# Patient Record
Sex: Female | Born: 1935 | Race: White | Hispanic: No | Smoking: Former smoker
Health system: Southern US, Community
[De-identification: ages and names within clinical notes are randomized; demographics above are authoritative.]

## PROBLEM LIST (undated history)

## (undated) DIAGNOSIS — C801 Malignant (primary) neoplasm, unspecified: Secondary | ICD-10-CM

## (undated) DIAGNOSIS — R351 Nocturia: Secondary | ICD-10-CM

## (undated) DIAGNOSIS — N189 Chronic kidney disease, unspecified: Secondary | ICD-10-CM

## (undated) DIAGNOSIS — I10 Essential (primary) hypertension: Secondary | ICD-10-CM

## (undated) DIAGNOSIS — Z86018 Personal history of other benign neoplasm: Secondary | ICD-10-CM

## (undated) DIAGNOSIS — R682 Dry mouth, unspecified: Secondary | ICD-10-CM

## (undated) DIAGNOSIS — G4700 Insomnia, unspecified: Secondary | ICD-10-CM

## (undated) DIAGNOSIS — R32 Unspecified urinary incontinence: Secondary | ICD-10-CM

## (undated) DIAGNOSIS — Z8541 Personal history of malignant neoplasm of cervix uteri: Secondary | ICD-10-CM

## (undated) DIAGNOSIS — F419 Anxiety disorder, unspecified: Secondary | ICD-10-CM

## (undated) DIAGNOSIS — R2689 Other abnormalities of gait and mobility: Secondary | ICD-10-CM

## (undated) DIAGNOSIS — F329 Major depressive disorder, single episode, unspecified: Secondary | ICD-10-CM

## (undated) DIAGNOSIS — K5900 Constipation, unspecified: Secondary | ICD-10-CM

## (undated) DIAGNOSIS — K219 Gastro-esophageal reflux disease without esophagitis: Secondary | ICD-10-CM

## (undated) DIAGNOSIS — R0602 Shortness of breath: Secondary | ICD-10-CM

## (undated) DIAGNOSIS — F32A Depression, unspecified: Secondary | ICD-10-CM

## (undated) DIAGNOSIS — H3530 Unspecified macular degeneration: Secondary | ICD-10-CM

## (undated) DIAGNOSIS — R3915 Urgency of urination: Secondary | ICD-10-CM

## (undated) DIAGNOSIS — R51 Headache: Secondary | ICD-10-CM

## (undated) DIAGNOSIS — N3020 Other chronic cystitis without hematuria: Secondary | ICD-10-CM

## (undated) DIAGNOSIS — H269 Unspecified cataract: Secondary | ICD-10-CM

## (undated) DIAGNOSIS — K649 Unspecified hemorrhoids: Secondary | ICD-10-CM

## (undated) DIAGNOSIS — E7800 Pure hypercholesterolemia, unspecified: Secondary | ICD-10-CM

## (undated) DIAGNOSIS — M549 Dorsalgia, unspecified: Secondary | ICD-10-CM

## (undated) DIAGNOSIS — M797 Fibromyalgia: Secondary | ICD-10-CM

## (undated) DIAGNOSIS — H9190 Unspecified hearing loss, unspecified ear: Secondary | ICD-10-CM

## (undated) DIAGNOSIS — IMO0001 Reserved for inherently not codable concepts without codable children: Secondary | ICD-10-CM

## (undated) DIAGNOSIS — H6690 Otitis media, unspecified, unspecified ear: Secondary | ICD-10-CM

## (undated) DIAGNOSIS — R519 Headache, unspecified: Secondary | ICD-10-CM

## (undated) DIAGNOSIS — N3010 Interstitial cystitis (chronic) without hematuria: Secondary | ICD-10-CM

## (undated) DIAGNOSIS — K589 Irritable bowel syndrome without diarrhea: Secondary | ICD-10-CM

## (undated) DIAGNOSIS — Z9889 Other specified postprocedural states: Secondary | ICD-10-CM

## (undated) DIAGNOSIS — R350 Frequency of micturition: Secondary | ICD-10-CM

## (undated) DIAGNOSIS — D649 Anemia, unspecified: Secondary | ICD-10-CM

## (undated) DIAGNOSIS — G8929 Other chronic pain: Secondary | ICD-10-CM

## (undated) DIAGNOSIS — H9319 Tinnitus, unspecified ear: Secondary | ICD-10-CM

## (undated) DIAGNOSIS — J45909 Unspecified asthma, uncomplicated: Secondary | ICD-10-CM

## (undated) DIAGNOSIS — M48061 Spinal stenosis, lumbar region without neurogenic claudication: Secondary | ICD-10-CM

## (undated) HISTORY — PX: CERVICAL CONIZATION W/BX: SHX1330

## (undated) HISTORY — PX: TONSILLECTOMY AND ADENOIDECTOMY: SUR1326

## (undated) HISTORY — PX: DILATION AND CURETTAGE OF UTERUS: SHX78

## (undated) HISTORY — PX: LUMBAR SPINE SURGERY: SHX701

## (undated) HISTORY — PX: REVISION TOTAL KNEE ARTHROPLASTY: SUR1280

## (undated) HISTORY — DX: Unspecified hemorrhoids: K64.9

## (undated) HISTORY — PX: APPENDECTOMY: SHX54

## (undated) HISTORY — PX: BRAIN SURGERY: SHX531

## (undated) HISTORY — PX: TUBAL LIGATION: SHX77

## (undated) HISTORY — PX: OTHER SURGICAL HISTORY: SHX169

## (undated) HISTORY — DX: Irritable bowel syndrome, unspecified: K58.9

## (undated) HISTORY — DX: Pure hypercholesterolemia, unspecified: E78.00

## (undated) HISTORY — PX: CYSTOSCOPY WITH URETHRAL DILATATION: SHX5125

## (undated) HISTORY — PX: VAGINAL HYSTERECTOMY: SUR661

## (undated) HISTORY — DX: Anemia, unspecified: D64.9

## (undated) HISTORY — PX: BREAST CYST EXCISION: SHX579

---

## 1970-08-03 HISTORY — PX: LIPOMA RESECTION: SHX23

## 1971-08-04 HISTORY — PX: ANTERIOR AND POSTERIOR REPAIR: SHX1172

## 1978-08-03 HISTORY — PX: NEUROPLASTY / TRANSPOSITION MEDIAN NERVE AT CARPAL TUNNEL BILATERAL: SUR894

## 1986-08-03 DIAGNOSIS — Z86018 Personal history of other benign neoplasm: Secondary | ICD-10-CM

## 1986-08-03 DIAGNOSIS — Z9889 Other specified postprocedural states: Secondary | ICD-10-CM

## 1986-08-03 HISTORY — DX: Personal history of other benign neoplasm: Z98.890

## 1986-08-03 HISTORY — PX: CRANIECTOMY FOR EXCISION OF ACOUSTIC NEUROMA: SUR324

## 1986-08-03 HISTORY — DX: Personal history of other benign neoplasm: Z86.018

## 1990-08-03 HISTORY — PX: MELANOMA EXCISION: SHX5266

## 1996-08-03 HISTORY — PX: PUBOVAGINAL SLING: SHX1035

## 1997-08-03 HISTORY — PX: REPLACEMENT TOTAL KNEE: SUR1224

## 1999-11-19 ENCOUNTER — Encounter: Payer: Self-pay | Admitting: Urology

## 1999-11-21 ENCOUNTER — Ambulatory Visit (HOSPITAL_COMMUNITY): Admission: RE | Admit: 1999-11-21 | Discharge: 1999-11-21 | Payer: Self-pay | Admitting: Urology

## 2000-02-02 ENCOUNTER — Encounter: Payer: Self-pay | Admitting: Urology

## 2000-02-02 ENCOUNTER — Encounter: Admission: RE | Admit: 2000-02-02 | Discharge: 2000-02-02 | Payer: Self-pay | Admitting: Urology

## 2000-02-03 ENCOUNTER — Encounter: Payer: Self-pay | Admitting: Urology

## 2000-02-03 ENCOUNTER — Encounter: Admission: RE | Admit: 2000-02-03 | Discharge: 2000-02-03 | Payer: Self-pay | Admitting: Urology

## 2000-06-03 ENCOUNTER — Ambulatory Visit (HOSPITAL_COMMUNITY): Admission: RE | Admit: 2000-06-03 | Discharge: 2000-06-03 | Payer: Self-pay | Admitting: Gastroenterology

## 2000-10-15 ENCOUNTER — Ambulatory Visit (HOSPITAL_COMMUNITY): Admission: RE | Admit: 2000-10-15 | Discharge: 2000-10-15 | Payer: Self-pay | Admitting: Urology

## 2004-05-06 ENCOUNTER — Emergency Department: Payer: Self-pay | Admitting: Emergency Medicine

## 2004-08-03 HISTORY — PX: TOTAL KNEE ARTHROPLASTY: SHX125

## 2004-10-27 ENCOUNTER — Ambulatory Visit: Payer: Self-pay | Admitting: Internal Medicine

## 2004-11-04 ENCOUNTER — Emergency Department: Payer: Self-pay | Admitting: Unknown Physician Specialty

## 2005-02-05 ENCOUNTER — Ambulatory Visit: Payer: Self-pay | Admitting: Internal Medicine

## 2006-08-03 HISTORY — PX: JOINT REPLACEMENT: SHX530

## 2007-01-01 ENCOUNTER — Ambulatory Visit: Payer: Self-pay | Admitting: Internal Medicine

## 2007-01-17 ENCOUNTER — Ambulatory Visit: Payer: Self-pay | Admitting: General Practice

## 2007-01-31 ENCOUNTER — Inpatient Hospital Stay: Payer: Self-pay | Admitting: General Practice

## 2007-02-04 ENCOUNTER — Encounter: Payer: Self-pay | Admitting: Internal Medicine

## 2007-02-28 ENCOUNTER — Ambulatory Visit: Payer: Self-pay | Admitting: General Practice

## 2007-03-30 ENCOUNTER — Ambulatory Visit: Payer: Self-pay | Admitting: General Practice

## 2007-10-17 ENCOUNTER — Ambulatory Visit: Payer: Self-pay | Admitting: Pain Medicine

## 2007-11-03 ENCOUNTER — Ambulatory Visit: Payer: Self-pay | Admitting: Pain Medicine

## 2007-11-21 ENCOUNTER — Ambulatory Visit: Payer: Self-pay | Admitting: Pain Medicine

## 2007-11-24 ENCOUNTER — Ambulatory Visit: Payer: Self-pay | Admitting: Pain Medicine

## 2007-12-08 ENCOUNTER — Ambulatory Visit: Payer: Self-pay | Admitting: Physician Assistant

## 2007-12-15 ENCOUNTER — Ambulatory Visit: Payer: Self-pay | Admitting: Pain Medicine

## 2007-12-29 ENCOUNTER — Ambulatory Visit: Payer: Self-pay | Admitting: Physician Assistant

## 2008-01-16 ENCOUNTER — Ambulatory Visit: Payer: Self-pay | Admitting: Pain Medicine

## 2008-04-13 ENCOUNTER — Ambulatory Visit: Payer: Self-pay | Admitting: Neurology

## 2008-10-15 ENCOUNTER — Ambulatory Visit: Payer: Self-pay | Admitting: Physician Assistant

## 2008-11-22 ENCOUNTER — Ambulatory Visit: Payer: Self-pay | Admitting: Obstetrics and Gynecology

## 2009-12-27 ENCOUNTER — Ambulatory Visit: Payer: Self-pay | Admitting: Internal Medicine

## 2010-06-19 ENCOUNTER — Ambulatory Visit: Payer: Self-pay | Admitting: Dermatology

## 2011-07-01 ENCOUNTER — Ambulatory Visit: Payer: Self-pay | Admitting: Gastroenterology

## 2011-07-06 ENCOUNTER — Other Ambulatory Visit: Payer: Self-pay | Admitting: Urology

## 2011-07-07 ENCOUNTER — Ambulatory Visit: Payer: Self-pay | Admitting: Gastroenterology

## 2011-07-22 ENCOUNTER — Encounter (HOSPITAL_BASED_OUTPATIENT_CLINIC_OR_DEPARTMENT_OTHER): Payer: Self-pay | Admitting: *Deleted

## 2011-07-22 NOTE — Progress Notes (Signed)
NPO AFTER MN. PT ARRIVES AT 1015. NEEDS ISTAT AND EKG. WILL TAKE PEPCID AND OXYCONTIN AM OF SURG. W/ SIP OF WATER.

## 2011-07-24 ENCOUNTER — Encounter (HOSPITAL_BASED_OUTPATIENT_CLINIC_OR_DEPARTMENT_OTHER)
Admission: RE | Disposition: A | Discharge status: Home / Self Care | Payer: Self-pay | Source: Ambulatory Visit | Attending: Urology

## 2011-07-24 ENCOUNTER — Encounter (HOSPITAL_BASED_OUTPATIENT_CLINIC_OR_DEPARTMENT_OTHER): Payer: Self-pay | Admitting: Certified Registered"

## 2011-07-24 ENCOUNTER — Other Ambulatory Visit: Payer: Self-pay | Admitting: Urology

## 2011-07-24 ENCOUNTER — Other Ambulatory Visit: Payer: Self-pay

## 2011-07-24 ENCOUNTER — Ambulatory Visit (HOSPITAL_BASED_OUTPATIENT_CLINIC_OR_DEPARTMENT_OTHER)
Admission: RE | Admit: 2011-07-24 | Discharge: 2011-07-24 | Disposition: A | Discharge status: Home / Self Care | Payer: Medicare Other | Source: Ambulatory Visit | Attending: Urology | Admitting: Urology

## 2011-07-24 ENCOUNTER — Ambulatory Visit (HOSPITAL_BASED_OUTPATIENT_CLINIC_OR_DEPARTMENT_OTHER): Payer: Medicare Other | Admitting: Certified Registered"

## 2011-07-24 ENCOUNTER — Encounter (HOSPITAL_BASED_OUTPATIENT_CLINIC_OR_DEPARTMENT_OTHER): Payer: Self-pay | Admitting: *Deleted

## 2011-07-24 DIAGNOSIS — N289 Disorder of kidney and ureter, unspecified: Secondary | ICD-10-CM | POA: Insufficient documentation

## 2011-07-24 DIAGNOSIS — R109 Unspecified abdominal pain: Secondary | ICD-10-CM | POA: Insufficient documentation

## 2011-07-24 DIAGNOSIS — Z79899 Other long term (current) drug therapy: Secondary | ICD-10-CM | POA: Insufficient documentation

## 2011-07-24 DIAGNOSIS — Z8541 Personal history of malignant neoplasm of cervix uteri: Secondary | ICD-10-CM | POA: Insufficient documentation

## 2011-07-24 DIAGNOSIS — I10 Essential (primary) hypertension: Secondary | ICD-10-CM | POA: Insufficient documentation

## 2011-07-24 DIAGNOSIS — R1031 Right lower quadrant pain: Secondary | ICD-10-CM | POA: Insufficient documentation

## 2011-07-24 DIAGNOSIS — N1330 Unspecified hydronephrosis: Secondary | ICD-10-CM

## 2011-07-24 HISTORY — DX: Essential (primary) hypertension: I10

## 2011-07-24 HISTORY — DX: Reserved for inherently not codable concepts without codable children: IMO0001

## 2011-07-24 HISTORY — DX: Fibromyalgia: M79.7

## 2011-07-24 HISTORY — PX: CYSTOSCOPY/RETROGRADE/URETEROSCOPY: SHX5316

## 2011-07-24 HISTORY — DX: Other abnormalities of gait and mobility: R26.89

## 2011-07-24 HISTORY — DX: Other chronic pain: G89.29

## 2011-07-24 HISTORY — DX: Nocturia: R35.1

## 2011-07-24 HISTORY — DX: Spinal stenosis, lumbar region without neurogenic claudication: M48.061

## 2011-07-24 HISTORY — PX: CYSTOSCOPY WITH HYDRODISTENSION AND BIOPSY: SHX5127

## 2011-07-24 HISTORY — DX: Insomnia, unspecified: G47.00

## 2011-07-24 HISTORY — DX: Other specified postprocedural states: Z98.890

## 2011-07-24 HISTORY — DX: Dry mouth, unspecified: R68.2

## 2011-07-24 HISTORY — DX: Major depressive disorder, single episode, unspecified: F32.9

## 2011-07-24 HISTORY — DX: Dorsalgia, unspecified: M54.9

## 2011-07-24 HISTORY — DX: Unspecified urinary incontinence: R32

## 2011-07-24 HISTORY — DX: Personal history of other benign neoplasm: Z86.018

## 2011-07-24 HISTORY — DX: Unspecified macular degeneration: H35.30

## 2011-07-24 HISTORY — DX: Frequency of micturition: R35.0

## 2011-07-24 HISTORY — DX: Unspecified hearing loss, unspecified ear: H91.90

## 2011-07-24 HISTORY — DX: Shortness of breath: R06.02

## 2011-07-24 HISTORY — DX: Depression, unspecified: F32.A

## 2011-07-24 HISTORY — DX: Other chronic cystitis without hematuria: N30.20

## 2011-07-24 HISTORY — DX: Anxiety disorder, unspecified: F41.9

## 2011-07-24 HISTORY — DX: Personal history of malignant neoplasm of cervix uteri: Z85.41

## 2011-07-24 HISTORY — DX: Interstitial cystitis (chronic) without hematuria: N30.10

## 2011-07-24 HISTORY — DX: Unspecified cataract: H26.9

## 2011-07-24 HISTORY — DX: Urgency of urination: R39.15

## 2011-07-24 HISTORY — DX: Constipation, unspecified: K59.00

## 2011-07-24 LAB — POCT I-STAT 4, (NA,K, GLUC, HGB,HCT)
Glucose, Bld: 95 mg/dL (ref 70–99)
HCT: 37 % (ref 36.0–46.0)
Hemoglobin: 12.6 g/dL (ref 12.0–15.0)
Potassium: 4.5 meq/L (ref 3.5–5.1)
Sodium: 139 meq/L (ref 135–145)

## 2011-07-24 SURGERY — CYSTOSCOPY/RETROGRADE/URETEROSCOPY
Anesthesia: General | Laterality: Right

## 2011-07-24 MED ORDER — CIPROFLOXACIN IN D5W 400 MG/200ML IV SOLN
400.0000 mg | INTRAVENOUS | Status: AC
  Administered 2011-07-24: 400 mg via INTRAVENOUS

## 2011-07-24 MED ORDER — INDIGOTINDISULFONATE SODIUM 8 MG/ML IJ SOLN
INTRAMUSCULAR | Status: DC | PRN
  Administered 2011-07-24: 5 mL via INTRAVENOUS

## 2011-07-24 MED ORDER — HYDROCODONE-ACETAMINOPHEN 7.5-325 MG PO TABS
1.0000 | ORAL_TABLET | Freq: Four times a day (QID) | ORAL | Status: DC | PRN
  Administered 2011-07-24: 1 via ORAL

## 2011-07-24 MED ORDER — IOHEXOL 300 MG/ML  SOLN
INTRAMUSCULAR | Status: DC | PRN
  Administered 2011-07-24: 10 mL

## 2011-07-24 MED ORDER — ACETAMINOPHEN 10 MG/ML IV SOLN
INTRAVENOUS | Status: DC | PRN
  Administered 2011-07-24: 1000 mg via INTRAVENOUS

## 2011-07-24 MED ORDER — BELLADONNA ALKALOIDS-OPIUM 16.2-60 MG RE SUPP
RECTAL | Status: DC | PRN
  Administered 2011-07-24: 1 via RECTAL

## 2011-07-24 MED ORDER — PROMETHAZINE HCL 25 MG/ML IJ SOLN
6.2500 mg | INTRAMUSCULAR | Status: AC | PRN
  Administered 2011-07-24 (×2): 6.25 mg via INTRAVENOUS

## 2011-07-24 MED ORDER — HYDROCODONE-ACETAMINOPHEN 7.5-650 MG PO TABS: 1.0000 | ORAL_TABLET | Freq: Four times a day (QID) | ORAL | Status: AC | PRN

## 2011-07-24 MED ORDER — FENTANYL CITRATE 0.05 MG/ML IJ SOLN
25.0000 ug | INTRAMUSCULAR | Status: DC | PRN
  Administered 2011-07-24 (×2): 25 ug via INTRAVENOUS
  Administered 2011-07-24: 50 ug via INTRAVENOUS

## 2011-07-24 MED ORDER — EPHEDRINE SULFATE 50 MG/ML IJ SOLN
INTRAMUSCULAR | Status: DC | PRN
  Administered 2011-07-24: 10 mg via INTRAVENOUS

## 2011-07-24 MED ORDER — STERILE WATER FOR IRRIGATION IR SOLN
Status: DC | PRN
  Administered 2011-07-24: 3000 mL

## 2011-07-24 MED ORDER — URELLE 81 MG PO TABS
1.0000 | ORAL_TABLET | Freq: Four times a day (QID) | ORAL | Status: DC
  Administered 2011-07-24: 81 mg via ORAL

## 2011-07-24 MED ORDER — SODIUM CHLORIDE 0.9 % IR SOLN
Status: DC | PRN
  Administered 2011-07-24: 3000 mL

## 2011-07-24 MED ORDER — TRIMETHOPRIM 100 MG PO TABS: 100.0000 mg | ORAL_TABLET | ORAL | Status: AC

## 2011-07-24 MED ORDER — FENTANYL CITRATE 0.05 MG/ML IJ SOLN
INTRAMUSCULAR | Status: DC | PRN
  Administered 2011-07-24: 50 ug via INTRAVENOUS
  Administered 2011-07-24 (×2): 25 ug via INTRAVENOUS

## 2011-07-24 MED ORDER — LIDOCAINE HCL (CARDIAC) 20 MG/ML IV SOLN
INTRAVENOUS | Status: DC | PRN
  Administered 2011-07-24: 80 mg via INTRAVENOUS

## 2011-07-24 MED ORDER — LACTATED RINGERS IV SOLN: INTRAVENOUS | Status: DC

## 2011-07-24 MED ORDER — LACTATED RINGERS IV SOLN
INTRAVENOUS | Status: DC
  Administered 2011-07-24 (×2): via INTRAVENOUS

## 2011-07-24 MED ORDER — PROPOFOL 10 MG/ML IV EMUL
INTRAVENOUS | Status: DC | PRN
  Administered 2011-07-24: 160 mg via INTRAVENOUS

## 2011-07-24 MED ORDER — URELLE 81 MG PO TABS: 1.0000 | ORAL_TABLET | Freq: Three times a day (TID) | ORAL | Status: DC

## 2011-07-24 MED ORDER — KETOROLAC TROMETHAMINE 30 MG/ML IJ SOLN
INTRAMUSCULAR | Status: DC | PRN
  Administered 2011-07-24: 15 mg via INTRAVENOUS

## 2011-07-24 SURGICAL SUPPLY — 48 items
ADAPTER CATH URET PLST 4-6FR (CATHETERS) ×2 IMPLANT
ADAPTER CATH WHT DISP STRL (CATHETERS) IMPLANT
ADPR CATH URET STRL DISP 4-6FR (CATHETERS) ×1
BAG DRAIN URO-CYSTO SKYTR STRL (DRAIN) ×2 IMPLANT
BAG DRN UROCATH (DRAIN) ×1
BASKET LASER NITINOL 1.9FR (BASKET) IMPLANT
BASKET STNLS GEMINI 4WIRE 3FR (BASKET) IMPLANT
BASKET ZERO TIP NITINOL 2.4FR (BASKET) IMPLANT
BOOTIES KNEE HIGH SLOAN (MISCELLANEOUS) ×2 IMPLANT
BSKT STON RTRVL 120 1.9FR (BASKET)
CANISTER SUCT LVC 12 LTR MEDI- (MISCELLANEOUS) ×2 IMPLANT
CATH CLEAR GEL 3F BACKSTOP (CATHETERS) IMPLANT
CATH INTERMIT  6FR 70CM (CATHETERS) IMPLANT
CATH ROBINSON RED A/P 16FR (CATHETERS) IMPLANT
CATH URET 5FR 28IN CONE TIP (BALLOONS)
CATH URET 5FR 28IN OPEN ENDED (CATHETERS) IMPLANT
CATH URET 5FR 70CM CONE TIP (BALLOONS) IMPLANT
CLOTH BEACON ORANGE TIMEOUT ST (SAFETY) ×2 IMPLANT
DRAPE CAMERA CLOSED 9X96 (DRAPES) ×2 IMPLANT
ELECT REM PT RETURN 9FT ADLT (ELECTROSURGICAL) ×2
ELECTRODE REM PT RTRN 9FT ADLT (ELECTROSURGICAL) ×1 IMPLANT
GLOVE BIO SURGEON STRL SZ7.5 (GLOVE) ×2 IMPLANT
GLOVE ECLIPSE 8.0 STRL XLNG CF (GLOVE) IMPLANT
GLOVE INDICATOR 6.5 STRL GRN (GLOVE) ×2 IMPLANT
GOWN PREVENTION PLUS LG XLONG (DISPOSABLE) ×4 IMPLANT
GOWN STRL REIN XL XLG (GOWN DISPOSABLE) ×2 IMPLANT
GUIDEWIRE 0.038 PTFE COATED (WIRE) IMPLANT
GUIDEWIRE ANG ZIPWIRE 038X150 (WIRE) IMPLANT
GUIDEWIRE STR DUAL SENSOR (WIRE) IMPLANT
IV NS IRRIG 3000ML ARTHROMATIC (IV SOLUTION) ×4 IMPLANT
KIT BALLIN UROMAX 15FX10 (LABEL) IMPLANT
KIT BALLN UROMAX 15FX4 (MISCELLANEOUS) IMPLANT
KIT BALLN UROMAX 26 75X4 (MISCELLANEOUS)
LASER FIBER DISP (UROLOGICAL SUPPLIES) IMPLANT
LASER FIBER DISP 1000U (UROLOGICAL SUPPLIES) IMPLANT
NDL SAFETY ECLIPSE 18X1.5 (NEEDLE) ×1 IMPLANT
NEEDLE HYPO 18GX1.5 SHARP (NEEDLE) ×2
NEEDLE HYPO 22GX1.5 SAFETY (NEEDLE) IMPLANT
NEEDLE SPNL 22GX7 QUINCKE BK (NEEDLE) IMPLANT
NS IRRIG 500ML POUR BTL (IV SOLUTION) IMPLANT
PACK CYSTOSCOPY (CUSTOM PROCEDURE TRAY) ×2 IMPLANT
SET HIGH PRES BAL DIL (LABEL)
SHEATH URET ACCESS 12FR/35CM (UROLOGICAL SUPPLIES) IMPLANT
SHEATH URET ACCESS 12FR/55CM (UROLOGICAL SUPPLIES) IMPLANT
SYR 20CC LL (SYRINGE) ×4 IMPLANT
SYR BULB IRRIGATION 50ML (SYRINGE) IMPLANT
WATER STERILE IRR 3000ML UROMA (IV SOLUTION) ×2 IMPLANT
WATER STERILE IRR 500ML POUR (IV SOLUTION) ×2 IMPLANT

## 2011-07-24 NOTE — H&P (Signed)
Urology Admission H&P  Chief Complaint: R flank pain  History of Present Illness: 77 yo female with CT filling defect R lower ureter. No evidence of obstruction or inflammation. She has chronic R flank  And RLQ pain. UTI x 3 since August, with E. Coli on urine c/s. P/V sling 1999 ( Dr. Logan Bores) with cysto/HOD 2002. No hematuria, no dysuria, but "sprying" urine. Now for cysto and ureteroscopic evaluation of R ureter.  Past Medical History  Diagnosis Date  . Hypertension   . 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 Status post excision of acoustic neuroma 1988    RESIDUAL--  RIGHT HEARING LOSS, MILD IMPAIRED BALANCE, AND DRY MOUTH  . Balance problem MILD-- POST ACOUSTIC NEUROMA  . Dry mouth     POST ACOUSTIC NEUROMA  . Interstitial cystitis   . Chronic cystitis   . Incontinence of urine   . Frequency of urination   . Urgency of urination   . Nocturia   . Chronic back pain     CHRONIC NARCTIC -- GOES TO PAIN CLINIC  . SOB (shortness of breath) on exertion   . Constipated   . Fibromyalgia   . Lumbar stenosis   . Cataract immature BILATERAL  . Macular degeneration of both eyes   . Insomnia   . Anxiety   . Depression   . History of cervical cancer S/P VAG. HYSTECTOMY AGE 70   Past Surgical History  Procedure Date  . Total knee arthroplasty 2006    LEFT  . Replacement total knee 1999    RIGHT  . Revision total knee arthroplasty X2    RIGHT  . Cervical conization w/bx AGE 70    POST REPAIR OF BLEEDING  . Dilation and curettage of uterus AGE 70  . Vaginal hysterectomy AGE 70  . Appendectomy AGE  37  . Cystoscopy with urethral dilatation AGE 3  . Tubal ligation AGE 46  . Breast cyst excision AGE 79    RIGHT  . Tonsillectomy and adenoidectomy AGE 89  . Lipoma resection 1972    EXTENSIVE RIGHT HIP/THIGH  . Anterior and posterior repair 1973  . Neuroplasty / transposition median nerve at carpal tunnel bilateral 1980  . Craniectomy for excision of acoustic  neuroma 1988  . Melanoma excision 1992    LEFT SHOULDER  . Cysto/ hod X3   1997 - 1998  . Pubovaginal sling 1998  . Lumbar spine surgery 1993 & 1995    Home Medications:  Prescriptions prior to admission  Medication Sig Dispense Refill  . acetaminophen (TYLENOL) 500 MG tablet Take 500 mg by mouth every 6 (six) hours as needed.        . ALPRAZolam (XANAX) 0.25 MG tablet Take 0.25 mg by mouth at bedtime as needed.        Marland Kitchen amLODipine-benazepril (LOTREL) 5-40 MG per capsule Take 1 capsule by mouth daily.        . bisacodyl (DULCOLAX) 5 MG EC tablet Take 5 mg by mouth daily.        . budesonide-formoterol (SYMBICORT) 80-4.5 MCG/ACT inhaler Inhale 2 puffs into the lungs as needed.        . ciprofloxacin (CIPRO) 500 MG tablet Take 500 mg by mouth 2 (two) times daily.        . Famotidine (PEPCID PO) Take 1 tablet by mouth 2 (two) times daily.       Marland Kitchen FLUoxetine (PROZAC) 20 MG tablet Take 20 mg by mouth  daily.        . lidocaine (LIDODERM) 5 % Place 1 patch onto the skin daily. Remove & Discard patch within 12 hours or as directed by MD       . oxyCODONE (OXYCONTIN) 80 MG 12 hr tablet Take 80 mg by mouth 3 (three) times daily.        . Diphenhydramine-APAP, sleep, (TYLENOL PM EXTRA STRENGTH PO) Take by mouth as needed.         Allergies:  Allergies  Allergen Reactions  . Furacin (Nitrofurazone) Rash    TOPICAL  . Penicillins Rash  . Sulfa Antibiotics Rash    History reviewed. No pertinent family history. Social History:  reports that she quit smoking about 20 years ago. Her smoking use included Cigarettes. She smoked 1.5 packs per day. She has never used smokeless tobacco. She reports that she does not drink alcohol or use illicit drugs.  Review of Systems  Constitutional: Positive for weight loss and malaise/fatigue. Negative for fever, chills and diaphoresis.  HENT: Negative.  Negative for hearing loss, nosebleeds and congestion.   Eyes: Negative.  Negative for blurred vision.    Respiratory: Negative.  Negative for cough, hemoptysis, shortness of breath and wheezing.   Cardiovascular: Negative.  Negative for chest pain.  Gastrointestinal: Positive for abdominal pain. Negative for heartburn, nausea, vomiting, diarrhea and constipation.  Genitourinary: Positive for dysuria, urgency, frequency and flank pain. Negative for hematuria.  Musculoskeletal: Negative.   Skin: Negative.  Negative for rash.  Neurological: Negative.  Negative for weakness and headaches.  Endo/Heme/Allergies: Negative.   Psychiatric/Behavioral: Negative.     Physical Exam:  Vital signs in last 24 hours: Temp:  [98.1 F (36.7 C)] 98.1 F (36.7 C) (12/21 1029) Pulse Rate:  [81] 81  (12/21 1029) Resp:  [18] 18  (12/21 1029) BP: (110)/(60) 110/60 mmHg (12/21 1029) SpO2:  [94 %] 94 % (12/21 1029) Physical Exam  Constitutional: She appears well-developed and well-nourished.  HENT:  Head: Normocephalic.  Eyes: Pupils are equal, round, and reactive to light.  Neck: Normal range of motion.  Cardiovascular: Normal rate.   Respiratory: Effort normal.  GI: Soft. She exhibits no distension. There is no tenderness. There is no rebound and no guarding.  Genitourinary: Vagina normal.  Musculoskeletal: Normal range of motion.  Neurological: She is alert.  Skin: Skin is warm.  Psychiatric: She has a normal mood and affect.    Laboratory Data:  No results found for this or any previous visit (from the past 24 hour(s)). No results found for this or any previous visit (from the past 240 hour(s)). Creatinine: No results found for this basename: CREATININE:7 in the last 168 hours Baseline Creatinine:   Impression/Assessment:  Recurrent UTI, with ? IC, and now filling  Defect in R lower ureter. Note hx of cervical Ca. Denies tobacco exposure.   Plan:  Cysto, ureteroscopy. Retrograde pyelography.   Rand Etchison I 07/24/2011, 11:11 AM

## 2011-07-24 NOTE — Anesthesia Preprocedure Evaluation (Addendum)
Anesthesia Evaluation  Patient identified by MRN, date of birth, ID band Patient awake    Reviewed: Allergy & Precautions, H&P , NPO status , Patient's Chart, lab work & pertinent test results  Airway Mallampati: II TM Distance: >3 FB     Dental  (+) Upper Dentures and Dental Advisory Given   Pulmonary shortness of breath and with exertion,  clear to auscultation  Pulmonary exam normal       Cardiovascular Exercise Tolerance: Poor hypertension, Pt. on medications neg cardio ROS Regular Normal    Neuro/Psych Anxiety Depression  Neuromuscular disease    GI/Hepatic Neg liver ROS, GERD-  Medicated and Poorly Controlled,  Endo/Other  Negative Endocrine ROS  Renal/GU negative Renal ROS  Genitourinary negative   Musculoskeletal  (+) Fibromyalgia -, narcotic dependent  Abdominal Normal abdominal exam  (+)   Peds negative pediatric ROS (+)  Hematology negative hematology ROS (+)   Anesthesia Other Findings   Reproductive/Obstetrics negative OB ROS                         Anesthesia Physical Anesthesia Plan  ASA: II  Anesthesia Plan: General   Post-op Pain Management:    Induction: Intravenous  Airway Management Planned: LMA  Additional Equipment:   Intra-op Plan:   Post-operative Plan:   Informed Consent: I have reviewed the patients History and Physical, chart, labs and discussed the procedure including the risks, benefits and alternatives for the proposed anesthesia with the patient or authorized representative who has indicated his/her understanding and acceptance.   Dental advisory given  Plan Discussed with: CRNA  Anesthesia Plan Comments:         Anesthesia Quick Evaluation

## 2011-07-24 NOTE — Anesthesia Postprocedure Evaluation (Signed)
Anesthesia Post Note  Patient: Kathleen Bates  Procedure(s) Performed:  CYSTOSCOPY/RETROGRADE/URETEROSCOPY - Cystoscopy Right Ureteroscopy Bilateral RPG  C-ARM needed  **Patient has multiple allergies to medications**  **HX of A-fib & HTN**; CYSTOSCOPY/BIOPSY/HYDRODISTENSION - HOD Poss Biopsy of Rt Ureteral Lesion Meatal Dilation   Anesthesia type: General  Patient location: PACU  Post pain: Pain level controlled  Post assessment: Post-op Vital signs reviewed  Last Vitals:  Filed Vitals:   07/24/11 1240  BP: 141/80  Pulse:   Temp: 36.9 C  Resp: 12    Post vital signs: Reviewed  Level of consciousness: sedated  Complications: No apparent anesthesia complications

## 2011-07-24 NOTE — Op Note (Signed)
Preoperative diagnosis: R flank pain with abnormality in RL ureter by CT  Postoperative diagnosis: Same  Operation: Cystoscopy, bilateral retrograde pyelograms and R ureteroscopy, hydrodistention of the bladder  Surgeon: Nethra Mehlberg  Anesthesia: Gen. LMA.  Operation: After appropriate preanesthesia, the patient was brought to the operating room, and placed on the operating table in the supine position. General LMA anesthesia was introduced, and the patient was placed in the dorsal lithotomy position. The pubis was prepped with Betadine solution, and draped in the usual fashion. Time out was observed.   Procedure: Cystourethroscopy was accomplished, showing a normal urethra, normal bladder base, and normal trigone. Clear reflux was seen from both cortices. There was no trabeculation, no cellules, no diverticular formation. There was no bladder stones or tumors seen.  .  The patient was hydrodistended to 600 cc. The bladder was drained of fluid and bilateral retrograde pyelograms were performed. Right retrograde shows abnormality of R lower ureter, but with normal upper ureter, and normal renal pelvis. No proximal hydronephrosis. The L retrograde pyelogram showed normal calibre l ureter, no hydronephrosis and no caliectasis. Right ureteroscopy showed normal mucosa of the R ureter, without any obstruction. No stone or tumor was seen. The scope was passed to the level of the UPJ and retrograde showed normal UPJ and normal renal pelvis and calyces.    Bladder biopsies were obtained, and the areas cauterized.   She had been given B/O suppoitories, anc was given IV Tylenol and IV Toradol, and awakened and taken to PAR in good condition.

## 2011-07-24 NOTE — Transfer of Care (Signed)
Immediate Anesthesia Transfer of Care Note  Patient: Kathleen Bates  Procedure(s) Performed:  CYSTOSCOPY/RETROGRADE/URETEROSCOPY - Cystoscopy Right Ureteroscopy Bilateral RPG  C-ARM needed  **Patient has multiple allergies to medications**  **HX of A-fib & HTN**; CYSTOSCOPY/BIOPSY/HYDRODISTENSION - HOD Poss Biopsy of Rt Ureteral Lesion Meatal Dilation   Patient Location: PACU  Anesthesia Type: MAC  Level of Consciousness: awake, alert , oriented and patient cooperative  Airway & Oxygen Therapy: Patient Spontanous Breathing and Patient connected to face mask oxygen  Post-op Assessment: Report given to PACU RN and Post -op Vital signs reviewed and stable  Post vital signs: Reviewed and stable  Complications: No apparent anesthesia complications

## 2011-07-24 NOTE — Anesthesia Procedure Notes (Signed)
Procedure Name: LMA Insertion Date/Time: 07/24/2011 12:00 PM Performed by: Renella Cunas D Pre-anesthesia Checklist: Patient identified, Emergency Drugs available, Suction available and Patient being monitored Patient Re-evaluated:Patient Re-evaluated prior to inductionOxygen Delivery Method: Circle System Utilized Preoxygenation: Pre-oxygenation with 100% oxygen Intubation Type: IV induction Ventilation: Mask ventilation without difficulty LMA: LMA inserted LMA Size: 4.0 Number of attempts: 1 Placement Confirmation: positive ETCO2 Tube secured with: Tape Dental Injury: Teeth and Oropharynx as per pre-operative assessment

## 2011-07-24 NOTE — OR Nursing (Signed)
07/24/11 OR Nursing-Patient positioned prior to induction

## 2011-07-25 NOTE — Anesthesia Postprocedure Evaluation (Signed)
Anesthesia Post Note  Patient: Kathleen Bates  Procedure(s) Performed:  CYSTOSCOPY/RETROGRADE/URETEROSCOPY - Cystoscopy Right Ureteroscopy Bilateral RPG  C-ARM needed  **Patient has multiple allergies to medications**  **HX of A-fib & HTN**; CYSTOSCOPY/BIOPSY/HYDRODISTENSION - HOD Poss Biopsy of Rt Ureteral Lesion Meatal Dilation   Anesthesia type: General  Patient location: PACU  Post pain: Pain level controlled  Post assessment: Post-op Vital signs reviewed  Last Vitals:  Filed Vitals:   07/24/11 1753  BP: 147/79  Pulse: 81  Temp: 36.1 C  Resp: 18    Post vital signs: Reviewed  Level of consciousness: sedated  Complications: No apparent anesthesia complications

## 2011-07-30 ENCOUNTER — Encounter (HOSPITAL_BASED_OUTPATIENT_CLINIC_OR_DEPARTMENT_OTHER): Payer: Self-pay | Admitting: Urology

## 2011-08-13 ENCOUNTER — Ambulatory Visit: Payer: Self-pay | Admitting: Gastroenterology

## 2012-03-29 ENCOUNTER — Ambulatory Visit: Payer: Self-pay | Admitting: Internal Medicine

## 2012-08-31 ENCOUNTER — Ambulatory Visit: Payer: Medicare Other | Admitting: Infectious Disease

## 2013-05-24 DIAGNOSIS — M797 Fibromyalgia: Secondary | ICD-10-CM | POA: Insufficient documentation

## 2013-08-01 ENCOUNTER — Ambulatory Visit: Payer: Self-pay | Admitting: Internal Medicine

## 2013-11-18 ENCOUNTER — Emergency Department: Payer: Self-pay | Admitting: Internal Medicine

## 2014-01-11 ENCOUNTER — Ambulatory Visit: Payer: Self-pay | Admitting: Gastroenterology

## 2014-04-03 DIAGNOSIS — K219 Gastro-esophageal reflux disease without esophagitis: Secondary | ICD-10-CM | POA: Insufficient documentation

## 2014-04-03 DIAGNOSIS — E7800 Pure hypercholesterolemia, unspecified: Secondary | ICD-10-CM | POA: Insufficient documentation

## 2014-04-03 DIAGNOSIS — J45909 Unspecified asthma, uncomplicated: Secondary | ICD-10-CM | POA: Insufficient documentation

## 2014-04-03 DIAGNOSIS — I10 Essential (primary) hypertension: Secondary | ICD-10-CM | POA: Insufficient documentation

## 2014-05-09 DIAGNOSIS — M5030 Other cervical disc degeneration, unspecified cervical region: Secondary | ICD-10-CM | POA: Insufficient documentation

## 2014-05-17 ENCOUNTER — Ambulatory Visit: Payer: Self-pay | Admitting: Nurse Practitioner

## 2014-06-07 ENCOUNTER — Ambulatory Visit: Payer: Self-pay | Admitting: General Practice

## 2014-06-07 LAB — CBC WITH DIFFERENTIAL/PLATELET
Basophil #: 0 10*3/uL (ref 0.0–0.1)
Basophil %: 0.6 %
Eosinophil #: 0.3 10*3/uL (ref 0.0–0.7)
Eosinophil %: 4.6 %
HCT: 40 % (ref 35.0–47.0)
HGB: 13.4 g/dL (ref 12.0–16.0)
Lymphocyte #: 1.8 10*3/uL (ref 1.0–3.6)
Lymphocyte %: 28.8 %
MCH: 30.5 pg (ref 26.0–34.0)
MCHC: 33.4 g/dL (ref 32.0–36.0)
MCV: 91 fL (ref 80–100)
Monocyte #: 0.5 x10 3/mm (ref 0.2–0.9)
Monocyte %: 7.6 %
NEUTROS PCT: 58.4 %
Neutrophil #: 3.7 10*3/uL (ref 1.4–6.5)
PLATELETS: 176 10*3/uL (ref 150–440)
RBC: 4.38 10*6/uL (ref 3.80–5.20)
RDW: 12.6 % (ref 11.5–14.5)
WBC: 6.4 10*3/uL (ref 3.6–11.0)

## 2014-06-07 LAB — BASIC METABOLIC PANEL
Anion Gap: 5 — ABNORMAL LOW (ref 7–16)
BUN: 15 mg/dL (ref 7–18)
CALCIUM: 8.4 mg/dL — AB (ref 8.5–10.1)
CHLORIDE: 103 mmol/L (ref 98–107)
Co2: 31 mmol/L (ref 21–32)
Creatinine: 0.7 mg/dL (ref 0.60–1.30)
EGFR (Non-African Amer.): >60
GLUCOSE: 103 mg/dL — AB (ref 65–99)
Osmolality: 279 (ref 275–301)
Potassium: 4.2 mmol/L (ref 3.5–5.1)
SODIUM: 139 mmol/L (ref 136–145)

## 2014-06-15 DIAGNOSIS — M75122 Complete rotator cuff tear or rupture of left shoulder, not specified as traumatic: Secondary | ICD-10-CM | POA: Insufficient documentation

## 2014-06-18 ENCOUNTER — Ambulatory Visit: Payer: Self-pay | Admitting: General Practice

## 2014-06-25 DIAGNOSIS — Z9889 Other specified postprocedural states: Secondary | ICD-10-CM | POA: Insufficient documentation

## 2014-10-09 DIAGNOSIS — K649 Unspecified hemorrhoids: Secondary | ICD-10-CM | POA: Insufficient documentation

## 2014-10-09 DIAGNOSIS — Z85820 Personal history of malignant melanoma of skin: Secondary | ICD-10-CM | POA: Insufficient documentation

## 2014-10-09 DIAGNOSIS — M4800 Spinal stenosis, site unspecified: Secondary | ICD-10-CM | POA: Insufficient documentation

## 2014-11-24 NOTE — Op Note (Signed)
PATIENT NAME:  Kathleen Bates, Kathleen Bates MR#:  [id number] DATE OF BIRTH:  [birth year]  DATE OF PROCEDURE:  06/18/2014  PREOPERATIVE DIAGNOSIS:  Right rotator cuff tear (supraspinatus).   POSTOPERATIVE DIAGNOSIS: Right rotator cuff tear (supraspinatus).   PROCEDURE PERFORMED: Right subacromial decompression and rotator cuff repair.   SURGEON: Laurice Record. Holley Bouche., MD   ANESTHESIA: Interscalene block and general endotracheal.   ESTIMATED BLOOD LOSS: 10 mL.   FLUIDS REPLACED: 1300 mL of crystalloid.   DRAINS: None.   IMPLANTS UTILIZED: ArthroCare Spartan 5.5 mm PEEK suture anchors x 2.  INDICATIONS FOR SURGERY: The patient is a 81 year old female who has been seen for complaints of persistent right shoulder pain and weakness. MRI demonstrated findings consistent with full-thickness tear of the supraspinatus with retraction. After discussion of the risks and benefits of surgical intervention, the patient expressed understanding of the risks and benefits, and agreed with plans for surgical intervention.   PROCEDURE IN DETAIL: The patient was brought in the operating room and after adequate interscalene block and general endotracheal anesthesia was achieved, the patient was placed in the modified beach chair position. The head was secured in headrest and all bony prominences were well padded. The patient's right shoulder and arm were cleaned and prepped with alcohol and DuraPrep, draped in the usual sterile fashion. A "timeout" was performed as per usual protocol. The anticipated incision site was injected with 0.25% Marcaine with epinephrine.   An anterior oblique incision was made roughly bisecting the anterior aspect of the acromion. The deltoid was split in line and a "deltoid on" approach was utilized by elevating the deltoid in a subperiosteal fashion off of the acromion. The subdeltoid bursa was noted to be extremely thickened and was subsequently excised. There was prominent anterior subacromial spur  with significant impingement noted. An anterior-inferior wafer of bone was removed using an osteotome with thickness measuring approximately 4-5 mm.  TPS high-speed rasp was used to subsequently contour and debride the site.  The wound was irrigated with copious amounts of normal saline with antibiotic solution. The shoulder was placed through a range of motion. There was noted to be a large tear of the anterior aspect of the supraspinatus with retraction. The leading edge of the supraspinatus was sharply debrided. Washers were used to create a bed of bleeding bone medial to the greater tuberosity. Two ArthroCare Spartan 5.5 mm PEEK suture anchors were inserted and the supraspinatus was repaired with the associated FiberWire sutures. The shoulder was placed through a range of motion with excellent maintenance of the repair appreciated and good decompression of the subacromial space. The wound was irrigated with copious amounts of normal saline with antibiotic solution and then suctioned dry. Good hemostasis was appreciated. The deltoid was repaired in a side-to-side fashion using interrupted sutures of #1 Ethibond.  The subcutaneous tissue was approximated in layers using first #0 Vicryl followed by #2-0 Vicryl. The skin was closed with a running subcuticular suture of #4-0 Vicryl. Steri-Strips were applied followed by application of a sling-and-swathe.  The patient  tolerated the procedure well. She was transported to the recovery room in stable condition.    ____________________________ Laurice Record. Holley Bouche., MD jph:LT D: 06/18/2014 98:92:11 ET T: 06/18/2014 20:00:33 ET JOB#: [id number]  cc: Laurice Record. Holley Bouche., MD, <Dictator> JAMES P Holley Bouche MD ELECTRONICALLY SIGNED 06/21/2014 21:40

## 2015-01-24 ENCOUNTER — Other Ambulatory Visit: Payer: Self-pay | Admitting: Internal Medicine

## 2015-01-24 DIAGNOSIS — Z1231 Encounter for screening mammogram for malignant neoplasm of breast: Secondary | ICD-10-CM

## 2015-01-25 ENCOUNTER — Ambulatory Visit: Payer: PRIVATE HEALTH INSURANCE

## 2015-01-28 ENCOUNTER — Ambulatory Visit
Admission: RE | Admit: 2015-01-28 | Discharge: 2015-01-28 | Disposition: A | Discharge status: Home / Self Care | Payer: Medicare Other | Source: Ambulatory Visit | Attending: Internal Medicine | Admitting: Internal Medicine

## 2015-01-28 ENCOUNTER — Other Ambulatory Visit: Payer: Self-pay | Admitting: Internal Medicine

## 2015-01-28 DIAGNOSIS — Z1231 Encounter for screening mammogram for malignant neoplasm of breast: Secondary | ICD-10-CM | POA: Diagnosis present

## 2015-02-25 ENCOUNTER — Other Ambulatory Visit: Payer: Self-pay | Admitting: Internal Medicine

## 2015-02-25 DIAGNOSIS — R1011 Right upper quadrant pain: Secondary | ICD-10-CM

## 2015-02-28 ENCOUNTER — Ambulatory Visit
Admission: RE | Admit: 2015-02-28 | Discharge: 2015-02-28 | Disposition: A | Discharge status: Home / Self Care | Payer: Medicare Other | Source: Ambulatory Visit | Attending: Internal Medicine | Admitting: Internal Medicine

## 2015-02-28 DIAGNOSIS — N281 Cyst of kidney, acquired: Secondary | ICD-10-CM | POA: Insufficient documentation

## 2015-02-28 DIAGNOSIS — R1011 Right upper quadrant pain: Secondary | ICD-10-CM | POA: Diagnosis present

## 2015-02-28 DIAGNOSIS — K8020 Calculus of gallbladder without cholecystitis without obstruction: Secondary | ICD-10-CM | POA: Diagnosis not present

## 2015-03-15 ENCOUNTER — Encounter: Payer: Self-pay | Admitting: *Deleted

## 2015-03-18 ENCOUNTER — Other Ambulatory Visit: Payer: Self-pay | Admitting: *Deleted

## 2015-03-18 ENCOUNTER — Other Ambulatory Visit
Admission: RE | Admit: 2015-03-18 | Discharge: 2015-03-18 | Disposition: A | Discharge status: Home / Self Care | Payer: Medicare Other | Source: Ambulatory Visit | Attending: Surgery | Admitting: Surgery

## 2015-03-18 ENCOUNTER — Ambulatory Visit (INDEPENDENT_AMBULATORY_CARE_PROVIDER_SITE_OTHER): Payer: Medicare Other | Admitting: Surgery

## 2015-03-18 ENCOUNTER — Encounter: Payer: Self-pay | Admitting: Surgery

## 2015-03-18 ENCOUNTER — Telehealth: Payer: Self-pay | Admitting: *Deleted

## 2015-03-18 VITALS — BP 124/69 | HR 85 | Temp 98.7°F | Ht 68.0 in | Wt 183.0 lb

## 2015-03-18 DIAGNOSIS — K819 Cholecystitis, unspecified: Secondary | ICD-10-CM | POA: Insufficient documentation

## 2015-03-18 DIAGNOSIS — K8020 Calculus of gallbladder without cholecystitis without obstruction: Secondary | ICD-10-CM | POA: Diagnosis not present

## 2015-03-18 LAB — COMPREHENSIVE METABOLIC PANEL
ALT: 19 U/L (ref 14–54)
AST: 20 U/L (ref 15–41)
Albumin: 4.2 g/dL (ref 3.5–5.0)
Alkaline Phosphatase: 78 U/L (ref 38–126)
Anion gap: 9 (ref 5–15)
BUN: 15 mg/dL (ref 6–20)
CO2: 30 mmol/L (ref 22–32)
Calcium: 9.3 mg/dL (ref 8.9–10.3)
Chloride: 100 mmol/L — ABNORMAL LOW (ref 101–111)
Creatinine, Ser: 0.7 mg/dL (ref 0.44–1.00)
GFR calc Af Amer: >60 mL/min (ref >60)
GFR calc non Af Amer: >60 mL/min (ref >60)
Glucose, Bld: 103 mg/dL — ABNORMAL HIGH (ref 65–99)
Potassium: 4.1 mmol/L (ref 3.5–5.1)
Sodium: 139 mmol/L (ref 135–145)
TOTAL PROTEIN: 7.7 g/dL (ref 6.5–8.1)
Total Bilirubin: 0.6 mg/dL (ref 0.3–1.2)

## 2015-03-18 LAB — CBC WITH DIFFERENTIAL/PLATELET
Basophils Absolute: 0 10*3/uL (ref 0–0.1)
Basophils Relative: 1 %
Eosinophils Absolute: 0.2 10*3/uL (ref 0–0.7)
Eosinophils Relative: 4 %
HEMATOCRIT: 40.4 % (ref 35.0–47.0)
Hemoglobin: 13.9 g/dL (ref 12.0–16.0)
LYMPHS PCT: 22 %
Lymphs Abs: 1.5 10*3/uL (ref 1.0–3.6)
MCH: 30.5 pg (ref 26.0–34.0)
MCHC: 34.4 g/dL (ref 32.0–36.0)
MCV: 88.7 fL (ref 80.0–100.0)
MONO ABS: 0.3 10*3/uL (ref 0.2–0.9)
Monocytes Relative: 5 %
NEUTROS ABS: 4.6 10*3/uL (ref 1.4–6.5)
Neutrophils Relative %: 68 %
Platelets: 177 10*3/uL (ref 150–440)
RBC: 4.55 MIL/uL (ref 3.80–5.20)
RDW: 12.4 % (ref 11.5–14.5)
WBC: 6.7 10*3/uL (ref 3.6–11.0)

## 2015-03-18 NOTE — H&P (Signed)
CC: RUQ pain x 8 months  HPI: Kathleen Bates is a pleasant 81 yo F with a history of recurrent UTI and pyelonephritis who presents with recurrent RUQ pain.  She says that it is worse with fatty foods.  Also has some back pain but says this is likely due to her multiple back surgeries.  H/o ulcers but says that this feels different.  Does not feel like similar episodes of pyelonephritis.  No fevers/chills, night sweats, shortness of breath, cough, chest pain, nausea/vomiting, diarrhea/constipation, dysuria/hematuria.  Active Ambulatory Problems    Diagnosis Date Noted  . No Active Ambulatory Problems   Resolved Ambulatory Problems    Diagnosis Date Noted  . No Resolved Ambulatory Problems   Past Medical History  Diagnosis Date  . Hypertension   . 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 Status post excision of acoustic neuroma 1988  . Balance problem MILD-- POST ACOUSTIC NEUROMA  . Dry mouth   . Interstitial cystitis   . Chronic cystitis   . Incontinence of urine   . Frequency of urination   . Urgency of urination   . Nocturia   . Chronic back pain   . SOB (shortness of breath) on exertion   . Constipated   . Fibromyalgia   . Lumbar stenosis   . Cataract immature BILATERAL  . Macular degeneration of both eyes   . Insomnia   . Anxiety   . Depression   . History of cervical cancer S/P VAG. HYSTECTOMY AGE 10  . Hypercholesteremia   . IBS (irritable bowel syndrome)   . Hemorrhoids   . Anemia    Past Surgical History  Procedure Laterality Date  . Total knee arthroplasty  2006    LEFT  . Replacement total knee  1999    RIGHT  . Revision total knee arthroplasty  X2    RIGHT  . Cervical conization w/bx  AGE 25    POST REPAIR OF BLEEDING  . Dilation and curettage of uterus  AGE 25  . Vaginal hysterectomy  AGE 25  . Appendectomy  AGE  76  . Cystoscopy with urethral dilatation  AGE 62  . Tubal ligation  AGE 70  . Tonsillectomy and adenoidectomy  AGE 77  .  Lipoma resection  1972    EXTENSIVE RIGHT HIP/THIGH  . Anterior and posterior repair  1973  . Neuroplasty / transposition median nerve at carpal tunnel bilateral  1980  . Craniectomy for excision of acoustic neuroma  1988  . Melanoma excision  1992    LEFT SHOULDER  . Cysto/ [place]  . Pubovaginal sling  1998  . Lumbar spine surgery  [place]  . Cystoscopy/retrograde/ureteroscopy  07/24/2011    Procedure: CYSTOSCOPY/RETROGRADE/URETEROSCOPY;  Surgeon: Ailene Rud, MD;  Location: [facility];  Service: Urology;  Laterality: Right;  Cystoscopy Right Ureteroscopy Bilateral RPG  C-ARM needed  **Patient has multiple allergies to medications**  **HX of A-fib & HTN**  . Cystoscopy with hydrodistension and biopsy  07/24/2011    Procedure: CYSTOSCOPY/BIOPSY/HYDRODISTENSION;  Surgeon: Ailene Rud, MD;  Location: [facility];  Service: Urology;  Laterality: Right;  HOD Poss Biopsy of Rt Ureteral Lesion Meatal Dilation   . Breast cyst excision Left AGE 65     Medication List       This list is accurate as of: 03/18/15  2:27 PM.  Always use your most recent med  list.               acetaminophen 325 MG tablet  Commonly known as:  TYLENOL  Take 325 mg by mouth.     ALPRAZolam 0.25 MG tablet  Commonly known as:  XANAX  Take 0.25 mg by mouth.     amLODipine 10 MG tablet  Commonly known as:  NORVASC  Take 10 mg by mouth.     benazepril 40 MG tablet  Commonly known as:  LOTENSIN  Take 40 mg by mouth.     ciprofloxacin 500 MG tablet  Commonly known as:  CIPRO  Take by mouth.     diphenhydrAMINE 25 mg capsule  Commonly known as:  BENADRYL  Take 25 mg by mouth.     DULCOLAX 10 MG suppository  Generic drug:  bisacodyl  Place 10 mg rectally.     DULCOLAX 5 MG EC tablet  Generic drug:  bisacodyl  Take 5 mg by mouth.     famotidine 20 MG tablet  Commonly known as:  PEPCID  Take 20 mg by mouth.     FLUoxetine  40 MG capsule  Commonly known as:  PROZAC  Take 40 mg by mouth.     OxyCODONE HCl ER 30 MG T12a  Take 30 mg po daily along with 40 mg BID (separate RX)     OxyCODONE 40 mg T12a 12 hr tablet  Commonly known as:  OXYCONTIN  Take 40 mg PO BID a day along with 30 mg daily (separate RX)     Oxycodone HCl 10 MG Tabs  Take 10 mg by mouth.     polyethylene glycol packet  Commonly known as:  MIRALAX / GLYCOLAX  Take by mouth.       Social History   Social History  . Marital Status: Married    Spouse Name: N/A  . Number of Children: N/A  . Years of Education: N/A   Occupational History  . Not on file.   Social History Main Topics  . Smoking status: Former Smoker -- 1.50 packs/day    Types: Cigarettes    Quit date: 07/22/1991  . Smokeless tobacco: Never Used  . Alcohol Use: No     Comment: RARE  . Drug Use: No  . Sexual Activity: Not on file   Other Topics Concern  . Not on file   Social History Narrative   Family History  Problem Relation Age of Onset  . Hypertension Mother   . Stroke Mother   . Cancer Father    ROS: Full ROS obtained, pertinent positives and negatives as above  Blood pressure 124/69, pulse 85, temperature 98.7 F (37.1 C), temperature source Oral, height 5\' 8"  (1.727 m), weight 183 lb (83.008 kg). GEN: NAD/A&Ox3 FACE: no obvious facial trauma, normal external nose, normal external ears EYES: no scleral icterus, no conjunctivitis HEAD: normocephalic atraumatic CV: RRR, no MRG RESP: moving air well, lungs clear ABD: soft, tender to palpation RUQ, nondistended EXT: moving all ext well, strength 5/5 NEURO: cnII-XII grossly intact, sensation intact all 4 ext  Labs: No new labs  Imaging: gallstones, 8 mm cbd  A/P 81 yo F with recurrent RUQ pain worse with fatty food intake, gallstones on ultrasound.  Although patient has history of recurrent UTI, this is a new pain in a new location.  Treatment for UTI has not resolved pain.  I feel that pain is  biliary.  I have thus offered cholecystectomy with cholangiogram.  I have explained  the procedure, its benefits and risks including 1:200 risk of CBD injury.  She would like to proceed.

## 2015-03-18 NOTE — Telephone Encounter (Signed)
Patient will need a EKG and a Chest 2 view before her pre op appointment. Patient to be schedule 04/01/15 at [facility] for Cholecystectomy.

## 2015-03-18 NOTE — Progress Notes (Signed)
CC: RUQ pain x 8 months  HPI: Kathleen Bates is a pleasant 81 yo F with a history of recurrent UTI and pyelonephritis who presents with recurrent RUQ pain.  She says that it is worse with fatty foods.  Also has some back pain but says this is likely due to her multiple back surgeries.  H/o ulcers but says that this feels different.  Does not feel like similar episodes of pyelonephritis.  No fevers/chills, night sweats, shortness of breath, cough, chest pain, nausea/vomiting, diarrhea/constipation, dysuria/hematuria.  Active Ambulatory Problems    Diagnosis Date Noted  . No Active Ambulatory Problems   Resolved Ambulatory Problems    Diagnosis Date Noted  . No Resolved Ambulatory Problems   Past Medical History  Diagnosis Date  . Hypertension   . 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 Status post excision of acoustic neuroma 1988  . Balance problem MILD-- POST ACOUSTIC NEUROMA  . Dry mouth   . Interstitial cystitis   . Chronic cystitis   . Incontinence of urine   . Frequency of urination   . Urgency of urination   . Nocturia   . Chronic back pain   . SOB (shortness of breath) on exertion   . Constipated   . Fibromyalgia   . Lumbar stenosis   . Cataract immature BILATERAL  . Macular degeneration of both eyes   . Insomnia   . Anxiety   . Depression   . History of cervical cancer S/P VAG. HYSTECTOMY AGE 36  . Hypercholesteremia   . IBS (irritable bowel syndrome)   . Hemorrhoids   . Anemia    Past Surgical History  Procedure Laterality Date  . Total knee arthroplasty  2006    LEFT  . Replacement total knee  1999    RIGHT  . Revision total knee arthroplasty  X2    RIGHT  . Cervical conization w/bx  AGE 85    POST REPAIR OF BLEEDING  . Dilation and curettage of uterus  AGE 85  . Vaginal hysterectomy  AGE 85  . Appendectomy  AGE  89  . Cystoscopy with urethral dilatation  AGE 89  . Tubal ligation  AGE 65  . Tonsillectomy and adenoidectomy  AGE 40  .  Lipoma resection  1972    EXTENSIVE RIGHT HIP/THIGH  . Anterior and posterior repair  1973  . Neuroplasty / transposition median nerve at carpal tunnel bilateral  1980  . Craniectomy for excision of acoustic neuroma  1988  . Melanoma excision  1992    LEFT SHOULDER  . Cysto/ [place]  . Pubovaginal sling  1998  . Lumbar spine surgery  [place]  . Cystoscopy/retrograde/ureteroscopy  07/24/2011    Procedure: CYSTOSCOPY/RETROGRADE/URETEROSCOPY;  Surgeon: Ailene Rud, MD;  [facility];  Service: Urology;  Laterality: Right;  Cystoscopy Right Ureteroscopy Bilateral RPG  C-ARM needed  **Patient has multiple allergies to medications**  **HX of A-fib & HTN**  . Cystoscopy with hydrodistension and biopsy  07/24/2011    Procedure: CYSTOSCOPY/BIOPSY/HYDRODISTENSION;  Surgeon: Ailene Rud, MD;  Location: [facility];  Service: Urology;  Laterality: Right;  HOD Poss Biopsy of Rt Ureteral Lesion Meatal Dilation   . Breast cyst excision Left AGE 69     Medication List       This list is accurate as of: 03/18/15  2:27 PM.  Always use your most recent med  list.               acetaminophen 325 MG tablet  Commonly known as:  TYLENOL  Take 325 mg by mouth.     ALPRAZolam 0.25 MG tablet  Commonly known as:  XANAX  Take 0.25 mg by mouth.     amLODipine 10 MG tablet  Commonly known as:  NORVASC  Take 10 mg by mouth.     benazepril 40 MG tablet  Commonly known as:  LOTENSIN  Take 40 mg by mouth.     ciprofloxacin 500 MG tablet  Commonly known as:  CIPRO  Take by mouth.     diphenhydrAMINE 25 mg capsule  Commonly known as:  BENADRYL  Take 25 mg by mouth.     DULCOLAX 10 MG suppository  Generic drug:  bisacodyl  Place 10 mg rectally.     DULCOLAX 5 MG EC tablet  Generic drug:  bisacodyl  Take 5 mg by mouth.     famotidine 20 MG tablet  Commonly known as:  PEPCID  Take 20 mg by mouth.     FLUoxetine  40 MG capsule  Commonly known as:  PROZAC  Take 40 mg by mouth.     OxyCODONE HCl ER 30 MG T12a  Take 30 mg po daily along with 40 mg BID (separate RX)     OxyCODONE 40 mg T12a 12 hr tablet  Commonly known as:  OXYCONTIN  Take 40 mg PO BID a day along with 30 mg daily (separate RX)     Oxycodone HCl 10 MG Tabs  Take 10 mg by mouth.     polyethylene glycol packet  Commonly known as:  MIRALAX / GLYCOLAX  Take by mouth.       Social History   Social History  . Marital Status: Married    Spouse Name: N/A  . Number of Children: N/A  . Years of Education: N/A   Occupational History  . Not on file.   Social History Main Topics  . Smoking status: Former Smoker -- 1.50 packs/day    Types: Cigarettes    Quit date: 07/22/1991  . Smokeless tobacco: Never Used  . Alcohol Use: No     Comment: RARE  . Drug Use: No  . Sexual Activity: Not on file   Other Topics Concern  . Not on file   Social History Narrative   Family History  Problem Relation Age of Onset  . Hypertension Mother   . Stroke Mother   . Cancer Father    ROS: Full ROS obtained, pertinent positives and negatives as above  Blood pressure 124/69, pulse 85, temperature 98.7 F (37.1 C), temperature source Oral, height 5\' 8"  (1.727 m), weight 183 lb (83.008 kg). GEN: NAD/A&Ox3 FACE: no obvious facial trauma, normal external nose, normal external ears EYES: no scleral icterus, no conjunctivitis HEAD: normocephalic atraumatic CV: RRR, no MRG RESP: moving air well, lungs clear ABD: soft, tender to palpation RUQ, nondistended EXT: moving all ext well, strength 5/5 NEURO: cnII-XII grossly intact, sensation intact all 4 ext  Labs: No new labs  Imaging: gallstones, 8 mm cbd  A/P 81 yo F with recurrent RUQ pain worse with fatty food intake, gallstones on ultrasound.  Although patient has history of recurrent UTI, this is a new pain in a new location.  Treatment for UTI has not resolved pain.  I feel that pain is  biliary.  I have thus offered cholecystectomy with cholangiogram.  I have explained  the procedure, its benefits and risks including 1:200 risk of CBD injury.  She would like to proceed.

## 2015-03-18 NOTE — Patient Instructions (Signed)
Call or return to ER if you develop fever greater than 101.5, nausea/vomiting, increased pain. ° °

## 2015-03-19 ENCOUNTER — Other Ambulatory Visit: Payer: Self-pay

## 2015-03-19 DIAGNOSIS — G8929 Other chronic pain: Secondary | ICD-10-CM

## 2015-03-19 DIAGNOSIS — R1011 Right upper quadrant pain: Principal | ICD-10-CM

## 2015-03-19 NOTE — Telephone Encounter (Signed)
Pt advised of pre op date/time and sx date. Sx: 04/01/15 with Dr Maren Beach chole with gram Pre op: 03/26/15 @9 :30am--office.

## 2015-03-26 ENCOUNTER — Ambulatory Visit
Admission: RE | Admit: 2015-03-26 | Discharge: 2015-03-26 | Disposition: A | Discharge status: Home / Self Care | Payer: Medicare Other | Source: Ambulatory Visit | Attending: Surgery | Admitting: Surgery

## 2015-03-26 ENCOUNTER — Encounter
Admission: RE | Admit: 2015-03-26 | Discharge: 2015-03-26 | Disposition: A | Discharge status: Home / Self Care | Payer: Medicare Other | Source: Ambulatory Visit | Attending: Surgery | Admitting: Surgery

## 2015-03-26 DIAGNOSIS — Z01818 Encounter for other preprocedural examination: Secondary | ICD-10-CM

## 2015-03-26 HISTORY — DX: Chronic kidney disease, unspecified: N18.9

## 2015-03-26 HISTORY — DX: Gastro-esophageal reflux disease without esophagitis: K21.9

## 2015-03-26 HISTORY — DX: Tinnitus, unspecified ear: H93.19

## 2015-03-26 LAB — CBC WITH DIFFERENTIAL/PLATELET
Basophils Absolute: 0 10*3/uL (ref 0–0.1)
Basophils Relative: 0 %
EOS ABS: 0.3 10*3/uL (ref 0–0.7)
Eosinophils Relative: 3 %
HCT: 40.7 % (ref 35.0–47.0)
Hemoglobin: 13.5 g/dL (ref 12.0–16.0)
Lymphocytes Relative: 17 %
Lymphs Abs: 1.6 10*3/uL (ref 1.0–3.6)
MCH: 30 pg (ref 26.0–34.0)
MCHC: 33.1 g/dL (ref 32.0–36.0)
MCV: 90.6 fL (ref 80.0–100.0)
Monocytes Absolute: 0.5 10*3/uL (ref 0.2–0.9)
Monocytes Relative: 6 %
Neutro Abs: 7.2 10*3/uL — ABNORMAL HIGH (ref 1.4–6.5)
Neutrophils Relative %: 74 %
PLATELETS: 190 10*3/uL (ref 150–440)
RBC: 4.49 MIL/uL (ref 3.80–5.20)
RDW: 12.5 % (ref 11.5–14.5)
WBC: 9.7 10*3/uL (ref 3.6–11.0)

## 2015-03-26 LAB — COMPREHENSIVE METABOLIC PANEL
ALT: 17 U/L (ref 14–54)
AST: 22 U/L (ref 15–41)
Albumin: 4 g/dL (ref 3.5–5.0)
Alkaline Phosphatase: 86 U/L (ref 38–126)
Anion gap: 9 (ref 5–15)
BUN: 16 mg/dL (ref 6–20)
CHLORIDE: 101 mmol/L (ref 101–111)
CO2: 27 mmol/L (ref 22–32)
CREATININE: 0.73 mg/dL (ref 0.44–1.00)
Calcium: 9 mg/dL (ref 8.9–10.3)
GFR calc non Af Amer: >60 mL/min (ref >60)
Glucose, Bld: 101 mg/dL — ABNORMAL HIGH (ref 65–99)
POTASSIUM: 3.8 mmol/L (ref 3.5–5.1)
SODIUM: 137 mmol/L (ref 135–145)
Total Bilirubin: 0.6 mg/dL (ref 0.3–1.2)
Total Protein: 7.4 g/dL (ref 6.5–8.1)

## 2015-03-26 NOTE — Pre-Procedure Instructions (Signed)
Chart and labs reviewed by anesthesia.

## 2015-03-26 NOTE — Patient Instructions (Signed)
  Your procedure is scheduled on: 04/01/15 Mon  Report to Day Surgery. To find out your arrival time please call [phone] between 1PM - 3PM on 03/29/15 Fri.  Remember: Instructions that are not followed completely may result in serious medical risk, up to and including death, or upon the discretion of your surgeon and anesthesiologist your surgery may need to be rescheduled.    _x___ 1. Do not eat food or drink liquids after midnight. No gum chewing or hard candies.     _x__ 2. No Alcohol for 24 hours before or after surgery.   ____ 3. Bring all medications with you on the day of surgery if instructed.    __x__ 4. Notify your doctor if there is any change in your medical condition     (cold, fever, infections).     Do not wear jewelry, make-up, hairpins, clips or nail polish.  Do not wear lotions, powders, or perfumes. You may wear deodorant.  Do not shave 48 hours prior to surgery. Men may shave face and neck.  Do not bring valuables to the hospital.    [facility] is not responsible for any belongings or valuables.               Contacts, dentures or bridgework may not be worn into surgery.  Leave your suitcase in the car. After surgery it may be brought to your room.  For patients admitted to the hospital, discharge time is determined by your                treatment team.   Patients discharged the day of surgery will not be allowed to drive home.   Please read over the following fact sheets that you were given:      _x___ Take these medicines the morning of surgery with A SIP OF WATER:    1. ALPRAZolam (XANAX) 0.25 MG tablet  2. amLODipine (NORVASC) 10 MG tablet  3. benazepril (LOTENSIN) 40 MG tablet  4.famotidine (PEPCID) 40 MG tablet  5.FLUoxetine (PROZAC) 40 MG capsule  6.  ____ Fleet Enema (as directed)   _x___ Use CHG Soap as directed  ____ Use inhalers on the day of surgery  ____ Stop metformin 2 days prior to surgery    ____ Take 1/2 of usual insulin dose  the night before surgery and none on the morning of surgery.   ____ Stop Coumadin/Plavix/aspirin on   ____ Stop Anti-inflammatories on    ____ Stop supplements until after surgery.    ____ Bring C-Pap to the hospital.

## 2015-04-01 ENCOUNTER — Encounter: Payer: Self-pay | Admitting: *Deleted

## 2015-04-01 ENCOUNTER — Encounter
Admission: RE | Disposition: A | Discharge status: Home / Self Care | Payer: Self-pay | Source: Ambulatory Visit | Attending: Surgery

## 2015-04-01 ENCOUNTER — Telehealth: Payer: Self-pay | Admitting: Surgery

## 2015-04-01 ENCOUNTER — Emergency Department
Admission: EM | Admit: 2015-04-01 | Discharge: 2015-04-01 | Disposition: A | Discharge status: Home / Self Care | Payer: Medicare Other | Attending: Emergency Medicine | Admitting: Emergency Medicine

## 2015-04-01 ENCOUNTER — Ambulatory Visit: Payer: Medicare Other | Admitting: Anesthesiology

## 2015-04-01 ENCOUNTER — Encounter: Payer: Self-pay | Admitting: Emergency Medicine

## 2015-04-01 ENCOUNTER — Ambulatory Visit (HOSPITAL_BASED_OUTPATIENT_CLINIC_OR_DEPARTMENT_OTHER)
Admission: RE | Admit: 2015-04-01 | Discharge: 2015-04-01 | Disposition: A | Discharge status: Home / Self Care | Payer: Medicare Other | Source: Ambulatory Visit | Attending: Surgery | Admitting: Surgery

## 2015-04-01 ENCOUNTER — Ambulatory Visit: Payer: Medicare Other

## 2015-04-01 DIAGNOSIS — Z87891 Personal history of nicotine dependence: Secondary | ICD-10-CM | POA: Insufficient documentation

## 2015-04-01 DIAGNOSIS — D649 Anemia, unspecified: Secondary | ICD-10-CM | POA: Diagnosis not present

## 2015-04-01 DIAGNOSIS — Z8541 Personal history of malignant neoplasm of cervix uteri: Secondary | ICD-10-CM | POA: Diagnosis not present

## 2015-04-01 DIAGNOSIS — K8012 Calculus of gallbladder with acute and chronic cholecystitis without obstruction: Secondary | ICD-10-CM | POA: Diagnosis not present

## 2015-04-01 DIAGNOSIS — K5900 Constipation, unspecified: Secondary | ICD-10-CM | POA: Insufficient documentation

## 2015-04-01 DIAGNOSIS — R682 Dry mouth, unspecified: Secondary | ICD-10-CM | POA: Diagnosis not present

## 2015-04-01 DIAGNOSIS — K589 Irritable bowel syndrome without diarrhea: Secondary | ICD-10-CM | POA: Diagnosis not present

## 2015-04-01 DIAGNOSIS — R1011 Right upper quadrant pain: Secondary | ICD-10-CM | POA: Diagnosis present

## 2015-04-01 DIAGNOSIS — H269 Unspecified cataract: Secondary | ICD-10-CM | POA: Diagnosis not present

## 2015-04-01 DIAGNOSIS — M797 Fibromyalgia: Secondary | ICD-10-CM | POA: Diagnosis not present

## 2015-04-01 DIAGNOSIS — Z79899 Other long term (current) drug therapy: Secondary | ICD-10-CM | POA: Insufficient documentation

## 2015-04-01 DIAGNOSIS — E780 Pure hypercholesterolemia: Secondary | ICD-10-CM | POA: Insufficient documentation

## 2015-04-01 DIAGNOSIS — M549 Dorsalgia, unspecified: Secondary | ICD-10-CM | POA: Diagnosis not present

## 2015-04-01 DIAGNOSIS — R339 Retention of urine, unspecified: Secondary | ICD-10-CM

## 2015-04-01 DIAGNOSIS — R350 Frequency of micturition: Secondary | ICD-10-CM | POA: Diagnosis not present

## 2015-04-01 DIAGNOSIS — G4700 Insomnia, unspecified: Secondary | ICD-10-CM | POA: Diagnosis not present

## 2015-04-01 DIAGNOSIS — I4891 Unspecified atrial fibrillation: Secondary | ICD-10-CM | POA: Insufficient documentation

## 2015-04-01 DIAGNOSIS — H9190 Unspecified hearing loss, unspecified ear: Secondary | ICD-10-CM | POA: Diagnosis not present

## 2015-04-01 DIAGNOSIS — F329 Major depressive disorder, single episode, unspecified: Secondary | ICD-10-CM | POA: Diagnosis not present

## 2015-04-01 DIAGNOSIS — R3915 Urgency of urination: Secondary | ICD-10-CM | POA: Insufficient documentation

## 2015-04-01 DIAGNOSIS — Z90710 Acquired absence of both cervix and uterus: Secondary | ICD-10-CM | POA: Insufficient documentation

## 2015-04-01 DIAGNOSIS — G8929 Other chronic pain: Secondary | ICD-10-CM | POA: Insufficient documentation

## 2015-04-01 DIAGNOSIS — R351 Nocturia: Secondary | ICD-10-CM | POA: Insufficient documentation

## 2015-04-01 DIAGNOSIS — K219 Gastro-esophageal reflux disease without esophagitis: Secondary | ICD-10-CM | POA: Insufficient documentation

## 2015-04-01 DIAGNOSIS — Z96653 Presence of artificial knee joint, bilateral: Secondary | ICD-10-CM | POA: Insufficient documentation

## 2015-04-01 DIAGNOSIS — I10 Essential (primary) hypertension: Secondary | ICD-10-CM | POA: Diagnosis not present

## 2015-04-01 DIAGNOSIS — R32 Unspecified urinary incontinence: Secondary | ICD-10-CM | POA: Diagnosis not present

## 2015-04-01 DIAGNOSIS — R0602 Shortness of breath: Secondary | ICD-10-CM | POA: Insufficient documentation

## 2015-04-01 DIAGNOSIS — N3010 Interstitial cystitis (chronic) without hematuria: Secondary | ICD-10-CM | POA: Diagnosis not present

## 2015-04-01 DIAGNOSIS — M4806 Spinal stenosis, lumbar region: Secondary | ICD-10-CM | POA: Insufficient documentation

## 2015-04-01 DIAGNOSIS — F419 Anxiety disorder, unspecified: Secondary | ICD-10-CM | POA: Diagnosis not present

## 2015-04-01 DIAGNOSIS — H3530 Unspecified macular degeneration: Secondary | ICD-10-CM | POA: Insufficient documentation

## 2015-04-01 DIAGNOSIS — K8020 Calculus of gallbladder without cholecystitis without obstruction: Secondary | ICD-10-CM | POA: Diagnosis not present

## 2015-04-01 HISTORY — PX: CHOLECYSTECTOMY: SHX55

## 2015-04-01 LAB — URINALYSIS COMPLETE WITH MICROSCOPIC (ARMC ONLY)
BACTERIA UA: NONE SEEN
Bilirubin Urine: NEGATIVE
Glucose, UA: NEGATIVE mg/dL
Hgb urine dipstick: NEGATIVE
Ketones, ur: NEGATIVE mg/dL
Leukocytes, UA: NEGATIVE
Nitrite: NEGATIVE
PROTEIN: NEGATIVE mg/dL
Specific Gravity, Urine: 1.013 (ref 1.005–1.030)
pH: 5 (ref 5.0–8.0)

## 2015-04-01 LAB — CBC WITH DIFFERENTIAL/PLATELET
BASOS ABS: 0 10*3/uL (ref 0–0.1)
Basophils Relative: 0 %
EOS PCT: 0 %
Eosinophils Absolute: 0 10*3/uL (ref 0–0.7)
HEMATOCRIT: 39.8 % (ref 35.0–47.0)
Hemoglobin: 13.1 g/dL (ref 12.0–16.0)
LYMPHS PCT: 5 %
Lymphs Abs: 0.9 10*3/uL — ABNORMAL LOW (ref 1.0–3.6)
MCH: 30 pg (ref 26.0–34.0)
MCHC: 33 g/dL (ref 32.0–36.0)
MCV: 90.9 fL (ref 80.0–100.0)
Monocytes Absolute: 0.6 10*3/uL (ref 0.2–0.9)
Monocytes Relative: 4 %
NEUTROS ABS: 15.5 10*3/uL — AB (ref 1.4–6.5)
Neutrophils Relative %: 91 %
PLATELETS: 178 10*3/uL (ref 150–440)
RBC: 4.37 MIL/uL (ref 3.80–5.20)
RDW: 12.4 % (ref 11.5–14.5)
WBC: 17 10*3/uL — AB (ref 3.6–11.0)

## 2015-04-01 LAB — BASIC METABOLIC PANEL
ANION GAP: 10 (ref 5–15)
BUN: 13 mg/dL (ref 6–20)
CO2: 27 mmol/L (ref 22–32)
Calcium: 9.2 mg/dL (ref 8.9–10.3)
Chloride: 101 mmol/L (ref 101–111)
Creatinine, Ser: 0.65 mg/dL (ref 0.44–1.00)
GLUCOSE: 133 mg/dL — AB (ref 65–99)
POTASSIUM: 4.7 mmol/L (ref 3.5–5.1)
Sodium: 138 mmol/L (ref 135–145)

## 2015-04-01 SURGERY — LAPAROSCOPIC CHOLECYSTECTOMY WITH INTRAOPERATIVE CHOLANGIOGRAM
Anesthesia: General | Wound class: Clean Contaminated

## 2015-04-01 MED ORDER — CIPROFLOXACIN IN D5W 400 MG/200ML IV SOLN
INTRAVENOUS | Status: AC
  Administered 2015-04-01: 400 mg via INTRAVENOUS
  Filled 2015-04-01: qty 200

## 2015-04-01 MED ORDER — BUPIVACAINE-EPINEPHRINE (PF) 0.25% -1:200000 IJ SOLN
INTRAMUSCULAR | Status: AC
  Filled 2015-04-01: qty 30

## 2015-04-01 MED ORDER — SUCCINYLCHOLINE CHLORIDE 20 MG/ML IJ SOLN
INTRAMUSCULAR | Status: DC | PRN
  Administered 2015-04-01: 100 mg via INTRAVENOUS

## 2015-04-01 MED ORDER — FENTANYL CITRATE (PF) 100 MCG/2ML IJ SOLN
25.0000 ug | INTRAMUSCULAR | Status: DC | PRN
  Administered 2015-04-01 (×4): 25 ug via INTRAVENOUS

## 2015-04-01 MED ORDER — PHENYLEPHRINE HCL 10 MG/ML IJ SOLN
INTRAMUSCULAR | Status: DC | PRN
  Administered 2015-04-01 (×2): 100 ug via INTRAVENOUS

## 2015-04-01 MED ORDER — CIPROFLOXACIN HCL 500 MG PO TABS: 500.0000 mg | ORAL_TABLET | Freq: Two times a day (BID) | ORAL | Status: DC

## 2015-04-01 MED ORDER — HYDROCODONE-ACETAMINOPHEN 5-325 MG PO TABS: 1.0000 | ORAL_TABLET | Freq: Four times a day (QID) | ORAL | Status: DC | PRN

## 2015-04-01 MED ORDER — IOTHALAMATE MEGLUMINE 60 % INJ SOLN
INTRAMUSCULAR | Status: DC | PRN
  Administered 2015-04-01: 20 mL

## 2015-04-01 MED ORDER — FENTANYL CITRATE (PF) 100 MCG/2ML IJ SOLN
INTRAMUSCULAR | Status: DC | PRN
  Administered 2015-04-01 (×2): 50 ug via INTRAVENOUS

## 2015-04-01 MED ORDER — CIPROFLOXACIN HCL 500 MG PO TABS
500.0000 mg | ORAL_TABLET | Freq: Once | ORAL | Status: AC
  Administered 2015-04-01: 500 mg via ORAL
  Filled 2015-04-01: qty 1

## 2015-04-01 MED ORDER — CIPROFLOXACIN IN D5W 400 MG/200ML IV SOLN
400.0000 mg | INTRAVENOUS | Status: AC
  Administered 2015-04-01: 400 mg via INTRAVENOUS

## 2015-04-01 MED ORDER — FENTANYL CITRATE (PF) 100 MCG/2ML IJ SOLN
INTRAMUSCULAR | Status: AC
  Administered 2015-04-01: 25 ug via INTRAVENOUS
  Filled 2015-04-01: qty 2

## 2015-04-01 MED ORDER — ONDANSETRON HCL 4 MG/2ML IJ SOLN
4.0000 mg | Freq: Once | INTRAMUSCULAR | Status: AC | PRN
  Administered 2015-04-01: 4 mg via INTRAVENOUS

## 2015-04-01 MED ORDER — MIDAZOLAM HCL 2 MG/2ML IJ SOLN
INTRAMUSCULAR | Status: DC | PRN
  Administered 2015-04-01: 2 mg via INTRAVENOUS

## 2015-04-01 MED ORDER — LIDOCAINE HCL 1 % IJ SOLN
INTRAMUSCULAR | Status: DC | PRN
  Administered 2015-04-01: 10 mL

## 2015-04-01 MED ORDER — SODIUM CHLORIDE 0.9 % IR SOLN
Status: DC | PRN
  Administered 2015-04-01: 1000 mL

## 2015-04-01 MED ORDER — ROCURONIUM BROMIDE 100 MG/10ML IV SOLN
INTRAVENOUS | Status: DC | PRN
Start: 2015-04-01 — End: 2015-04-01
  Administered 2015-04-01: 30 mg via INTRAVENOUS

## 2015-04-01 MED ORDER — LIDOCAINE HCL (PF) 1 % IJ SOLN
INTRAMUSCULAR | Status: AC
  Filled 2015-04-01: qty 30

## 2015-04-01 MED ORDER — GLYCOPYRROLATE 0.2 MG/ML IJ SOLN
INTRAMUSCULAR | Status: DC | PRN
  Administered 2015-04-01: 0.6 mg via INTRAVENOUS

## 2015-04-01 MED ORDER — PROPOFOL 10 MG/ML IV BOLUS
INTRAVENOUS | Status: DC | PRN
  Administered 2015-04-01: 140 mg via INTRAVENOUS

## 2015-04-01 MED ORDER — ONDANSETRON HCL 4 MG/2ML IJ SOLN
INTRAMUSCULAR | Status: DC | PRN
  Administered 2015-04-01: 4 mg via INTRAVENOUS

## 2015-04-01 MED ORDER — DEXAMETHASONE SODIUM PHOSPHATE 4 MG/ML IJ SOLN
INTRAMUSCULAR | Status: DC | PRN
  Administered 2015-04-01: 5 mg via INTRAVENOUS

## 2015-04-01 MED ORDER — LACTATED RINGERS IV SOLN
INTRAVENOUS | Status: DC
  Administered 2015-04-01: 50 mL/h via INTRAVENOUS

## 2015-04-01 MED ORDER — LIDOCAINE HCL (CARDIAC) 20 MG/ML IV SOLN
INTRAVENOUS | Status: DC | PRN
  Administered 2015-04-01: 60 mg via INTRAVENOUS

## 2015-04-01 MED ORDER — NEOSTIGMINE METHYLSULFATE 10 MG/10ML IV SOLN
INTRAVENOUS | Status: DC | PRN
  Administered 2015-04-01: 3 mg via INTRAVENOUS

## 2015-04-01 MED ORDER — DIPHENHYDRAMINE HCL 50 MG/ML IJ SOLN
INTRAMUSCULAR | Status: DC | PRN
  Administered 2015-04-01: 12.5 mg via INTRAVENOUS

## 2015-04-01 SURGICAL SUPPLY — 47 items
APPLIER CLIP ROT 10 11.4 M/L (STAPLE) ×2
BAG COUNTER SPONGE EZ (MISCELLANEOUS) IMPLANT
BLADE SURG SZ11 CARB STEEL (BLADE) ×2 IMPLANT
BULB RESERV EVAC DRAIN JP 100C (MISCELLANEOUS) IMPLANT
CANISTER SUCT 1200ML W/VALVE (MISCELLANEOUS) ×2 IMPLANT
CATH REDDICK CHOLANGI 4FR 50CM (CATHETERS) IMPLANT
CHLORAPREP W/TINT 26ML (MISCELLANEOUS) ×2 IMPLANT
CLIP APPLIE ROT 10 11.4 M/L (STAPLE) ×1 IMPLANT
CONRAY 60ML FOR OR (MISCELLANEOUS) ×2 IMPLANT
DISSECTOR KITTNER STICK (MISCELLANEOUS) IMPLANT
DISSECTORS/KITTNER STICK (MISCELLANEOUS)
DRAIN CHANNEL JP 19F (MISCELLANEOUS) IMPLANT
DRAPE SHEET LG 3/4 BI-LAMINATE (DRAPES) ×2 IMPLANT
DRSG TEGADERM 2-3/8X2-3/4 SM (GAUZE/BANDAGES/DRESSINGS) ×8 IMPLANT
DRSG TELFA 3X8 NADH (GAUZE/BANDAGES/DRESSINGS) ×2 IMPLANT
ENDOLOOP SUT PDS II  0 18 (SUTURE)
ENDOLOOP SUT PDS II 0 18 (SUTURE) IMPLANT
ENDOPOUCH RETRIEVER 10 (MISCELLANEOUS) ×2 IMPLANT
GLOVE BIO SURGEON STRL SZ7.5 (GLOVE) ×10 IMPLANT
GOWN STRL REUS W/ TWL LRG LVL3 (GOWN DISPOSABLE) ×3 IMPLANT
GOWN STRL REUS W/TWL LRG LVL3 (GOWN DISPOSABLE) ×3
IRRIGATION STRYKERFLOW (MISCELLANEOUS) ×1 IMPLANT
IRRIGATOR STRYKERFLOW (MISCELLANEOUS) ×2
IV CATH ANGIO 12GX3 LT BLUE (NEEDLE) ×2 IMPLANT
IV NS 1000ML (IV SOLUTION) ×1
IV NS 1000ML BAXH (IV SOLUTION) ×1 IMPLANT
LABEL OR SOLS (LABEL) ×2 IMPLANT
LIQUID BAND (GAUZE/BANDAGES/DRESSINGS) IMPLANT
NEEDLE HYPO 25X1 1.5 SAFETY (NEEDLE) ×2 IMPLANT
NS IRRIG 500ML POUR BTL (IV SOLUTION) ×2 IMPLANT
PACK LAP CHOLECYSTECTOMY (MISCELLANEOUS) ×2 IMPLANT
PAD GROUND ADULT SPLIT (MISCELLANEOUS) ×2 IMPLANT
SCISSORS METZENBAUM CVD 33 (INSTRUMENTS) ×2 IMPLANT
SEAL FOR SCOPE WARMER C3101 (MISCELLANEOUS) IMPLANT
SLEEVE ENDOPATH XCEL 5M (ENDOMECHANICALS) ×2 IMPLANT
STRAP SAFETY BODY (MISCELLANEOUS) ×2 IMPLANT
STRIP CLOSURE SKIN 1/2X4 (GAUZE/BANDAGES/DRESSINGS) ×2 IMPLANT
SUT MNCRL 4-0 (SUTURE) ×2
SUT MNCRL 4-0 27XMFL (SUTURE) ×2
SUT VICRYL 0 AB UR-6 (SUTURE) ×4 IMPLANT
SUTURE MNCRL 4-0 27XMF (SUTURE) ×2 IMPLANT
SWABSTK COMLB BENZOIN TINCTURE (MISCELLANEOUS) IMPLANT
TROCAR XCEL BLUNT TIP 100MML (ENDOMECHANICALS) ×2 IMPLANT
TROCAR XCEL NON-BLD 11X100MML (ENDOMECHANICALS) ×2 IMPLANT
TROCAR XCEL NON-BLD 5MMX100MML (ENDOMECHANICALS) ×2 IMPLANT
TUBING INSUFFLATOR HI FLOW (MISCELLANEOUS) ×2 IMPLANT
WATER STERILE IRR 1000ML POUR (IV SOLUTION) ×2 IMPLANT

## 2015-04-01 NOTE — Progress Notes (Signed)
No acute issues.  No changes to H and P.  Proceed with surgery.

## 2015-04-01 NOTE — Op Note (Signed)
Preop dx: Gallstones, mildly dilated CBD Postop dx: Same Procedure performed: Laparoscopic cholecystectomy with cholangiogram Anesthesia: General EBL: 10 ml Complications: None Specimen: gallbladder  Indication for surgery: Ms Fromm is a pleasant 81 yo F who presents with recurrent RUQ pain and gallstones.  She also was noted to have a mildly dilated CBD on ultrasound Her pain thought to be biliary in nature so I offered cholecystectomy.  Details of surgery: Informed consent was obtained.  Ms. Raglin was brought to the OR suite and laid supine on the OR table.  She was induced, ETT was placed, general anesthesia was administered.  Her abdomen was prepped and draped.  A timeout was performed correctly identifying patient name, operative site and procedure to be performed.  A supraumbilical incision was made and deepened to the fascia.  The fascia was incised, peritoneum was entered.  Two stay sutures were placed through the fasciotomy.  Hassan trocar was placed and abdomen was insufflated.  An 40mm epigastric and 2 5 mm subcostal trocars were placed.  Gallbladder was mildly inflamed and difficult to grab once adhesions taken down.  It was aspirated.  Cystic duct and cystic artery were dissected out and critical view was obtained.  Cystic artery was clipped and ligated, cystic duct was clipped distally and partially opened.  A cholangiogram was performed which showed no filling defects and contrast into duodenum.  The duct was then clipped proximally twice and ligated.  Gallbladder was then taken off fossa and removed through umbilicus. Fossa was made hemostatic and irrigated until hemostasis obtained.  Trocars were then removed and abdomen desufflated.  Supraumbilical fascia was then closed with previously placed stay sutures.  Skin was then closed with interrupted deep dermal 4-0 monocryl.  Suture strips, telfa and tegaderm were used to dress incision.  Patient was then awoken, extubated and brought to  PACU.  There were no immediate complications. Needle, sponge and instrument count was correct at the end of the procedure.

## 2015-04-01 NOTE — Discharge Instructions (Signed)
Do not drive on pain medications Do not lift greater than 15 lbs for a period of 6 weeks Call or return to ER if you develop fever greater than 101.5, nausea/vomiting, increased pain, redness/drainage from incisions Take bandages off in 48 hours.  Okay to shower with bandages on or after they come off, no tub baths

## 2015-04-01 NOTE — Anesthesia Preprocedure Evaluation (Addendum)
Anesthesia Evaluation  Patient identified by MRN, date of birth, ID band Patient awake    Reviewed: Allergy & Precautions, NPO status , Patient's Chart, lab work & pertinent test results, reviewed documented beta blocker date and time   Airway Mallampati: II  TM Distance: >3 FB     Dental  (+) Upper Dentures, Missing   Pulmonary shortness of breath, former smoker,          Cardiovascular hypertension, Pt. on medications     Neuro/Psych PSYCHIATRIC DISORDERS Anxiety Depression  Neuromuscular disease    GI/Hepatic GERD-  ,  Endo/Other    Renal/GU Renal InsufficiencyRenal disease     Musculoskeletal  (+) Fibromyalgia -  Abdominal   Peds  Hematology  (+) anemia ,   Anesthesia Other Findings Several missing teeth lower.  Reproductive/Obstetrics                            Anesthesia Physical Anesthesia Plan  ASA: II  Anesthesia Plan: General   Post-op Pain Management:    Induction: Intravenous  Airway Management Planned: Oral ETT  Additional Equipment:   Intra-op Plan:   Post-operative Plan:   Informed Consent: I have reviewed the patients History and Physical, chart, labs and discussed the procedure including the risks, benefits and alternatives for the proposed anesthesia with the patient or authorized representative who has indicated his/her understanding and acceptance.     Plan Discussed with: CRNA  Anesthesia Plan Comments:         Anesthesia Quick Evaluation

## 2015-04-01 NOTE — Transfer of Care (Signed)
Immediate Anesthesia Transfer of Care Note  Patient: Kathleen Bates  Procedure(s) Performed: Procedure(s): LAPAROSCOPIC CHOLECYSTECTOMY WITH INTRAOPERATIVE CHOLANGIOGRAM (N/A)  Patient Location: PACU  Anesthesia Type:General  Level of Consciousness: awake  Airway & Oxygen Therapy: Patient Spontanous Breathing and Patient connected to face mask oxygen  Post-op Assessment: Report given to RN and Post -op Vital signs reviewed and stable  Post vital signs: stable  Last Vitals:  Filed Vitals:   04/01/15 1156  BP: 124/63  Pulse: 70  Temp: 37 C  Resp: 14    Complications: No apparent anesthesia complications

## 2015-04-01 NOTE — Telephone Encounter (Signed)
Returned call to patient at this time. I explained that I cannot call in a catheter. If she needs to be catheterized, she needs to be seen in the Emergency Department since our office is closed. Patient's husband is going to see if he can find a catheter and he will get that or take patient to Emergency department.

## 2015-04-01 NOTE — ED Provider Notes (Signed)
[facility] Emergency Department Provider Note  ____________________________________________  Time seen: Approximately 253 PM  I have reviewed the triage vital signs and the nursing notes.   HISTORY  Chief Complaint Urinary Retention    HPI Kathleen Bates is a 81 y.o. female presenting with urinary retention after having gallbladder surgery earlier today. Patient says that she has had urinary retention in the past after anesthesia and required a Foley catheter. She also has a history of multiple UTIs. She is complaining of not urinating since prior to surgery this a.m. She was having pressure to the lower abdomen.Found to have 450 cc in her bladder by the bladder scanner.   Past Medical History  Diagnosis Date  . Hypertension   . 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 Status post excision of acoustic neuroma 1988    RESIDUAL--  RIGHT HEARING LOSS, MILD IMPAIRED BALANCE, AND DRY MOUTH  . Balance problem MILD-- POST ACOUSTIC NEUROMA  . Dry mouth     POST ACOUSTIC NEUROMA  . Interstitial cystitis   . Chronic cystitis   . Incontinence of urine   . Frequency of urination   . Urgency of urination   . Nocturia   . Chronic back pain     CHRONIC NARCTIC -- GOES TO PAIN CLINIC  . SOB (shortness of breath) on exertion   . Constipated   . Fibromyalgia   . Lumbar stenosis   . Cataract immature BILATERAL  . Macular degeneration of both eyes   . Insomnia   . Anxiety   . Depression   . History of cervical cancer S/P VAG. HYSTECTOMY AGE 4  . Hypercholesteremia   . IBS (irritable bowel syndrome)   . Hemorrhoids   . Anemia   . GERD (gastroesophageal reflux disease)   . Ringing in ear     right  . Chronic kidney disease     polynephritis/freq. uti    Patient Active Problem List   Diagnosis Date Noted  . Cholelithiasis     Past Surgical History  Procedure Laterality Date  . Total knee arthroplasty  2006    LEFT  . Replacement total  knee  1999    RIGHT  . Revision total knee arthroplasty  X2    RIGHT  . Cervical conization w/bx  AGE 48    POST REPAIR OF BLEEDING  . Dilation and curettage of uterus  AGE 48  . Vaginal hysterectomy  AGE 48  . Appendectomy  AGE  60  . Cystoscopy with urethral dilatation  AGE 89  . Tubal ligation  AGE 16  . Tonsillectomy and adenoidectomy  AGE 51  . Lipoma resection  1972    EXTENSIVE RIGHT HIP/THIGH  . Anterior and posterior repair  1973  . Neuroplasty / transposition median nerve at carpal tunnel bilateral  1980  . Craniectomy for excision of acoustic neuroma  1988  . Melanoma excision  1992    LEFT SHOULDER  . Cysto/ [place]  . Pubovaginal sling  1998  . Lumbar spine surgery  [place]    no metal  . Cystoscopy/retrograde/ureteroscopy  07/24/2011    Procedure: CYSTOSCOPY/RETROGRADE/URETEROSCOPY;  Surgeon: Ailene Rud, MD;  Location: [facility];  Service: Urology;  Laterality: Right;  Cystoscopy Right Ureteroscopy Bilateral RPG  C-ARM needed  **Patient has multiple allergies to medications**  **HX of A-fib & HTN**  . Cystoscopy with hydrodistension and biopsy  07/24/2011    Procedure: CYSTOSCOPY/BIOPSY/HYDRODISTENSION;  Surgeon: Ailene Rud, MD;  Location: [facility];  Service: Urology;  Laterality: Right;  HOD Poss Biopsy of Rt Ureteral Lesion Meatal Dilation   . Breast cyst excision Left AGE 48  . Joint replacement Bilateral 2008    right knee revision x 3.   . Cholecystectomy N/A 04/01/2015    Procedure: LAPAROSCOPIC CHOLECYSTECTOMY WITH INTRAOPERATIVE CHOLANGIOGRAM;  Surgeon: Marlyce Huge, MD;  Location: [facility];  Service: General;  Laterality: N/A;    Current Outpatient Rx  Name  Route  Sig  Dispense  Refill  . acetaminophen (TYLENOL) 325 MG tablet   Oral   Take 325 mg by mouth every 6 (six) hours as needed for mild pain, moderate pain, fever or headache.          . ALPRAZolam (XANAX)  0.25 MG tablet   Oral   Take 0.25 mg by mouth 3 (three) times daily as needed for anxiety.          Marland Kitchen amLODipine (NORVASC) 10 MG tablet   Oral   Take 10 mg by mouth daily.          . benazepril (LOTENSIN) 40 MG tablet   Oral   Take 40 mg by mouth daily.          . bisacodyl (DULCOLAX) 10 MG suppository   Rectal   Place 10 mg rectally daily as needed for mild constipation.          . bisacodyl (DULCOLAX) 5 MG EC tablet   Oral   Take 5 mg by mouth daily as needed for mild constipation or moderate constipation.          . diphenhydrAMINE (BENADRYL) 25 mg capsule   Oral   Take 25 mg by mouth every 6 (six) hours as needed for allergies.          . famotidine (PEPCID) 40 MG tablet   Oral   Take 40 mg by mouth 2 (two) times daily as needed (acid reflux).         Marland Kitchen FLUoxetine (PROZAC) 40 MG capsule   Oral   Take 40 mg by mouth daily.          Marland Kitchen HYDROcodone-acetaminophen (NORCO) 5-325 MG per tablet   Oral   Take 1-2 tablets by mouth every 6 (six) hours as needed for moderate pain or severe pain.   30 tablet   0   . OxyCODONE (OXYCONTIN) 40 mg T12A 12 hr tablet   Oral   Take 40 mg by mouth every 8 (eight) hours. Takes twice a day, then a 30 mg once a day when need litte.         . Oxycodone HCl 10 MG TABS   Oral   Take 10 mg by mouth 3 (three) times daily as needed (breakthrough pain).          . OxyCODONE HCl ER (OXYCONTIN) 30 MG T12A   Oral   Take 30 mg by mouth. Takes once a day, in addition to oxycontin 40 mg twice a day         . OxyCODONE HCl ER 30 MG T12A   Oral   Take 30 mg by mouth daily. Takes in addition to 40mg  tablet         . polyethylene glycol (MIRALAX / GLYCOLAX) packet   Oral   Take 17 g by mouth daily as needed.  Allergies Cobalamine combinations; Iodinated diagnostic agents; Amitriptyline; Butalbital-aspirin-caffeine; Furacin; Penicillins; and Sulfa antibiotics  Family History  Problem Relation Age of Onset   . Hypertension Mother   . Stroke Mother   . Cancer Father     Social History Social History  Substance Use Topics  . Smoking status: Former Smoker -- 1.50 packs/day    Types: Cigarettes    Quit date: 07/22/1991  . Smokeless tobacco: Never Used  . Alcohol Use: Yes     Comment: RARE    Review of Systems Constitutional: No fever/chills Eyes: No visual changes. ENT: No sore throat. Cardiovascular: Denies chest pain. Respiratory: Denies shortness of breath. Gastrointestinal:  No nausea, no vomiting.  No diarrhea.  No constipation. Genitourinary: As above  Musculoskeletal: Negative for back pain. Skin: Negative for rash. Neurological: Negative for headaches, focal weakness or numbness.  10-point ROS otherwise negative.  ____________________________________________   PHYSICAL EXAM:  VITAL SIGNS: ED Triage Vitals  Enc Vitals Group     BP 04/01/15 1811 125/57 mmHg     Pulse Rate 04/01/15 1811 96     Resp 04/01/15 1811 20     Temp 04/01/15 1811 100.1 F (37.8 C)     Temp Source 04/01/15 1811 Oral     SpO2 04/01/15 1811 96 %     Weight 04/01/15 1811 182 lb (82.555 kg)     Height 04/01/15 1811 5\' 8"  (1.727 m)     Head Cir --      Peak Flow --      Pain Score 04/01/15 1812 8     Pain Loc --      Pain Edu? --      Excl. in [place]? --     Constitutional: Alert and oriented. Well appearing and in no acute distress. Eyes: Conjunctivae are normal. PERRL. EOMI. Head: Atraumatic. Nose: No congestion/rhinnorhea. Mouth/Throat: Mucous membranes are moist.  Oropharynx non-erythematous. Neck: No stridor.   Cardiovascular: Normal rate, regular rhythm. Grossly normal heart sounds.  Good peripheral circulation. Respiratory: Normal respiratory effort.  No retractions. Lungs CTAB. Gastrointestinal: Soft with mild tenderness over the laparoscopic incision sites. The abdomen is soft and nondistended. The dressings are clean and dry and intact. Evaluate after Foley placement and no longer  with suprapubic tenderness. There is no ttp or fullness over the superpubic area since the Foley placement.. No distention. No abdominal bruits. No CVA tenderness. Genitourinary: Foley catheter with clear urine. Musculoskeletal: No lower extremity tenderness nor edema.  No joint effusions. Neurologic:  Normal speech and language. No gross focal neurologic deficits are appreciated. No gait instability. Skin:  Skin is warm, dry and intact. No rash noted. Psychiatric: Mood and affect are normal. Speech and behavior are normal.  ____________________________________________   LABS (all labs ordered are listed, but only abnormal results are displayed)  Labs Reviewed  URINALYSIS COMPLETEWITH MICROSCOPIC ([facility] ONLY) - Abnormal; Notable for the following:    Color, Urine YELLOW (*)    APPearance CLEAR (*)    Squamous Epithelial / LPF 0-5 (*)    All other components within normal limits  CBC WITH DIFFERENTIAL/PLATELET - Abnormal; Notable for the following:    WBC 17.0 (*)    Neutro Abs 15.5 (*)    Lymphs Abs 0.9 (*)    All other components within normal limits  BASIC METABOLIC PANEL - Abnormal; Notable for the following:    Glucose, Bld 133 (*)    All other components within normal limits   ____________________________________________  EKG   ____________________________________________  RADIOLOGY   ____________________________________________   PROCEDURES    ____________________________________________   INITIAL IMPRESSION / ASSESSMENT AND PLAN / ED COURSE  Pertinent labs & imaging results that were available during my care of the patient were reviewed by me and considered in my medical decision making (see chart for details).  ----------------------------------------- 8:39 PM on 04/01/2015 -----------------------------------------  Patient with relief of symptoms and no longer with any suprapubic abdominal pain after the catheter placement. Has a urologist that she  sees, Mcnicholas, and will follow-up with him. We'll discharge on Cipro secondary to instrumentation. White blood cell count secondary to postop. Patient knows to return if experiences increased pain, fever or any other concerning symptoms at home. Says has tolerated Cipro in the past. ____________________________________________   FINAL CLINICAL IMPRESSION(S) / ED DIAGNOSES  Acute urinary retention likely secondary to anesthesia. Initial visit.    Orbie Pyo, MD 04/01/15 [phone]

## 2015-04-01 NOTE — Telephone Encounter (Signed)
Pt needs a catheter called in.

## 2015-04-01 NOTE — Anesthesia Procedure Notes (Signed)
Procedure Name: Intubation Date/Time: 04/01/2015 10:43 AM Performed by: Aline Brochure Pre-anesthesia Checklist: Patient identified, Emergency Drugs available, Suction available and Patient being monitored Patient Re-evaluated:Patient Re-evaluated prior to inductionOxygen Delivery Method: Circle system utilized Preoxygenation: Pre-oxygenation with 100% oxygen Intubation Type: IV induction Ventilation: Mask ventilation without difficulty Laryngoscope Size: Mac and 3 Grade View: Grade I Tube type: Oral Tube size: 7.0 mm Number of attempts: 1 Airway Equipment and Method: Patient positioned with wedge pillow and Stylet Placement Confirmation: ETT inserted through vocal cords under direct vision,  CO2 detector and breath sounds checked- equal and bilateral Secured at: 21 cm Tube secured with: Tape Dental Injury: Teeth and Oropharynx as per pre-operative assessment

## 2015-04-01 NOTE — ED Notes (Signed)
Assumed pt care at this time. NAD noted. RR even and nonlabored. Family remains at bedside. Will continue to monitor. 

## 2015-04-01 NOTE — Discharge Instructions (Signed)
°  Acute Urinary Retention Urinary retention means you are unable to pee completely or at all (empty your bladder). HOME CARE  Drink enough fluids to keep your pee (urine) clear or pale yellow.  If you are sent home with a tube that drains the bladder (catheter), there will be a drainage bag attached to it. There are two types of bags. One is big that you can wear at night without having to empty it. One is smaller and needs to be emptied more often.  Keep the drainage bag emptied.  Keep the drainage bag lower than the tube.  Only take medicine as told by your doctor. GET HELP IF:  You have a low-grade fever.  You have spasms or you are leaking pee when you have spasms. GET HELP RIGHT AWAY IF:   You have chills or a fever.  Your catheter stops draining pee.  Your catheter falls out.  You have increased bleeding that does not stop after you have rested and increased the amount of fluids you had been drinking. MAKE SURE YOU:   Understand these instructions.  Will watch your condition.  Will get help right away if you are not doing well or get worse. Document Released: 01/06/2008 Document Revised: 07/25/2013 Document Reviewed: 12/29/2012 [facility] Patient Information 2015 [place], [place]. This information is not intended to replace advice given to you by your health care provider. Make sure you discuss any questions you have with your health care provider.

## 2015-04-01 NOTE — Brief Op Note (Signed)
04/01/2015  11:45 AM  PATIENT:  Kathleen Bates  81 y.o. female  PRE-OPERATIVE DIAGNOSIS:  Gallstones, dilated CBD  POST-OPERATIVE DIAGNOSIS:  Gallstones, dilated CBD  PROCEDURE:  Procedure(s): LAPAROSCOPIC CHOLECYSTECTOMY WITH INTRAOPERATIVE CHOLANGIOGRAM (N/A)  SURGEON:  Surgeon(s) and Role:    * Marlyce Huge, MD - Primary  PHYSICIAN ASSISTANT:   ASSISTANTS: none   ANESTHESIA:   general  EBL:   10 ml  BLOOD ADMINISTERED:none  DRAINS: none   LOCAL MEDICATIONS USED:  LIDOCAINE   SPECIMEN:  Fine Needle Aspirate  DISPOSITION OF SPECIMEN:  PATHOLOGY  COUNTS:  YES  TOURNIQUET:  * No tourniquets in log *  DICTATION: .Note written in EPIC  PLAN OF CARE: Discharge to home after PACU  PATIENT DISPOSITION:  PACU - hemodynamically stable.   Delay start of Pharmacological VTE agent (>24hrs) due to surgical blood loss or risk of bleeding: not applicable  Intraoperative findings.  Cholangiogram without obvious filling defects and rapid contrast into duodenum

## 2015-04-01 NOTE — ED Notes (Signed)
Pt just had gallbladder removed today and has been unable to void since the surgery.

## 2015-04-02 NOTE — Anesthesia Postprocedure Evaluation (Signed)
  Anesthesia Post-op Note  Patient: Kathleen Bates  Procedure(s) Performed: Procedure(s): LAPAROSCOPIC CHOLECYSTECTOMY WITH INTRAOPERATIVE CHOLANGIOGRAM (N/A)  Anesthesia type:General  Patient location: PACU  Post pain: Pain level controlled  Post assessment: Post-op Vital signs reviewed, Patient's Cardiovascular Status Stable, Respiratory Function Stable, Patent Airway and No signs of Nausea or vomiting  Post vital signs: Reviewed and stable  Last Vitals:  Filed Vitals:   04/01/15 1421  BP: 112/78  Pulse: 78  Temp: 36.9 C  Resp: 18    Level of consciousness: awake, alert  and patient cooperative  Complications: No apparent anesthesia complications

## 2015-04-03 LAB — SURGICAL PATHOLOGY

## 2015-04-10 ENCOUNTER — Ambulatory Visit (INDEPENDENT_AMBULATORY_CARE_PROVIDER_SITE_OTHER): Payer: Medicare Other | Admitting: Surgery

## 2015-04-10 ENCOUNTER — Encounter: Payer: Self-pay | Admitting: Surgery

## 2015-04-10 VITALS — BP 125/63 | HR 79 | Temp 98.3°F | Ht 68.0 in | Wt 183.0 lb

## 2015-04-10 DIAGNOSIS — Z09 Encounter for follow-up examination after completed treatment for conditions other than malignant neoplasm: Secondary | ICD-10-CM

## 2015-04-10 DIAGNOSIS — R531 Weakness: Secondary | ICD-10-CM

## 2015-04-10 MED ORDER — PANTOPRAZOLE SODIUM 40 MG PO TBEC: 40.0000 mg | DELAYED_RELEASE_TABLET | Freq: Every day | ORAL | Status: DC

## 2015-04-10 NOTE — Progress Notes (Signed)
Surgery Clinic Note  S: C/o weakness, reflux (which is not new), tolerating diet, regular BM.  Pain controlled with oxycontin that she obtains for back pain O:Blood pressure 125/63, pulse 79, temperature 98.3 F (36.8 C), temperature source Oral, height 5\' 8"  (1.727 m), weight 183 lb (83.008 kg). GEN: NAD/A&Ox3 ABD; soft, min tender, nondistended, incisions c/d/i  A/P 81 yo s/p lap chole, path reviewed - hgb per patient history has chronic anemia (13 prior to surgery) - protonix for reflux - recommended MOM and tums for acute reflux episodes - f/u in 2 weeks to ensure improvement

## 2015-04-10 NOTE — Patient Instructions (Signed)
Do not drive on pain medications Do not lift greater than 15 lbs for a period of 6 weeks Call or return to ER if you develop fever greater than 101.5, nausea/vomiting, increased pain, redness/drainage from incisions  

## 2015-04-11 LAB — HEMOGLOBIN: HEMOGLOBIN: 13.6 g/dL (ref 11.1–15.9)

## 2015-04-19 DIAGNOSIS — N3010 Interstitial cystitis (chronic) without hematuria: Secondary | ICD-10-CM | POA: Insufficient documentation

## 2015-04-23 ENCOUNTER — Encounter: Payer: Self-pay | Admitting: Surgery

## 2015-04-23 ENCOUNTER — Ambulatory Visit (INDEPENDENT_AMBULATORY_CARE_PROVIDER_SITE_OTHER): Payer: Medicare Other | Admitting: Surgery

## 2015-04-23 VITALS — BP 136/75 | HR 72 | Temp 98.4°F | Wt 182.0 lb

## 2015-04-23 DIAGNOSIS — Z09 Encounter for follow-up examination after completed treatment for conditions other than malignant neoplasm: Secondary | ICD-10-CM

## 2015-04-23 NOTE — Patient Instructions (Signed)
Do not drive on pain medications Do not lift greater than 15 lbs for a period of 6 weeks Call or return to ER if you develop fever greater than 101.5, nausea/vomiting, increased pain, redness/drainage from incisions  Call if you begin to have similar pain as before the surgery

## 2015-04-23 NOTE — Progress Notes (Signed)
Surgery Progress Note  S: Doing well.  Chronic pain on oxycontin.  1 episode of pain similar to before surgery last week.  Tolerating diet, having good BM O:Blood pressure 136/75, pulse 72, temperature 98.4 F (36.9 C), temperature source Oral, weight 182 lb (82.555 kg). GEN: NAD/A&Ox3 ABD: soft, min tender, nondistended, incisions c/d/i  A/P 81 yo s/p lap cholecystectomy, doing well - f/u prn

## 2015-05-06 ENCOUNTER — Telehealth: Payer: Self-pay

## 2015-05-06 DIAGNOSIS — R932 Abnormal findings on diagnostic imaging of liver and biliary tract: Secondary | ICD-10-CM

## 2015-05-06 MED ORDER — RANITIDINE HCL 150 MG PO TABS: 150.0000 mg | ORAL_TABLET | Freq: Once | ORAL | Status: DC

## 2015-05-06 MED ORDER — DIPHENHYDRAMINE HCL 50 MG PO TABS: 50.0000 mg | ORAL_TABLET | Freq: Once | ORAL | Status: DC

## 2015-05-06 MED ORDER — PREDNISONE 50 MG PO TABS: ORAL_TABLET | ORAL | Status: DC

## 2015-05-06 NOTE — Telephone Encounter (Signed)
Dr. Rexene Edison would like a CT (Abdomen and Pelvis) with contrast scheduled for this patient as patient had enlarged common bile duct on interoperative cholangiogram. Orders placed at this time. Patient is allergic to IVP Dye (causes Hives), Premedications per [facility], sent to Patient's [place] at this time.   CT Scan scheduled for 05/10/15 at 2:45pm. NPO except clears, 4 hours prior to scan. Pt will need to pick up prep at least one day prior.  Called to notify patient of what needs to be done and why, appointment information given. Pt thinks that she may have another appointment at [facility] around this time and may need to reschedule. Number given to Central Scheduling. Patient will notify me if she needs to reschedule this CT Scan.   I will call patient as soon as results have been received for this.

## 2015-05-10 ENCOUNTER — Other Ambulatory Visit: Payer: PRIVATE HEALTH INSURANCE

## 2015-05-10 DIAGNOSIS — G894 Chronic pain syndrome: Secondary | ICD-10-CM | POA: Insufficient documentation

## 2015-05-10 DIAGNOSIS — Z0289 Encounter for other administrative examinations: Secondary | ICD-10-CM | POA: Insufficient documentation

## 2015-05-10 DIAGNOSIS — M545 Low back pain, unspecified: Secondary | ICD-10-CM | POA: Insufficient documentation

## 2015-05-10 DIAGNOSIS — G8929 Other chronic pain: Secondary | ICD-10-CM | POA: Insufficient documentation

## 2015-05-14 ENCOUNTER — Ambulatory Visit
Admission: RE | Admit: 2015-05-14 | Discharge: 2015-05-14 | Disposition: A | Discharge status: Home / Self Care | Payer: Medicare Other | Source: Ambulatory Visit | Attending: Surgery | Admitting: Surgery

## 2015-05-14 DIAGNOSIS — R932 Abnormal findings on diagnostic imaging of liver and biliary tract: Secondary | ICD-10-CM | POA: Insufficient documentation

## 2015-05-14 DIAGNOSIS — M4316 Spondylolisthesis, lumbar region: Secondary | ICD-10-CM | POA: Diagnosis not present

## 2015-05-14 DIAGNOSIS — M47816 Spondylosis without myelopathy or radiculopathy, lumbar region: Secondary | ICD-10-CM | POA: Diagnosis not present

## 2015-05-14 DIAGNOSIS — I2510 Atherosclerotic heart disease of native coronary artery without angina pectoris: Secondary | ICD-10-CM | POA: Insufficient documentation

## 2015-05-14 HISTORY — DX: Malignant (primary) neoplasm, unspecified: C80.1

## 2015-05-14 LAB — POCT I-STAT CREATININE: Creatinine, Ser: 0.6 mg/dL (ref 0.44–1.00)

## 2015-05-14 MED ORDER — IOHEXOL 300 MG/ML  SOLN
85.0000 mL | Freq: Once | INTRAMUSCULAR | Status: AC | PRN
  Administered 2015-05-14: 85 mL via INTRAVENOUS

## 2015-05-15 ENCOUNTER — Telehealth: Payer: Self-pay

## 2015-05-15 ENCOUNTER — Ambulatory Visit (INDEPENDENT_AMBULATORY_CARE_PROVIDER_SITE_OTHER): Payer: Medicare Other | Admitting: Surgery

## 2015-05-15 ENCOUNTER — Encounter: Payer: Self-pay | Admitting: Surgery

## 2015-05-15 VITALS — BP 144/67 | HR 85 | Temp 98.1°F | Ht 68.0 in | Wt 182.4 lb

## 2015-05-15 DIAGNOSIS — Z09 Encounter for follow-up examination after completed treatment for conditions other than malignant neoplasm: Secondary | ICD-10-CM

## 2015-05-15 NOTE — Progress Notes (Signed)
Surgery progress note  S: Some back pain.  Otherwise eating well.  No pain, regular BM O:Blood pressure 144/67, pulse 85, temperature 98.1 F (36.7 C), temperature source Oral, height 5\' 8"  (1.727 m), weight 182 lb 6.4 oz (82.736 kg). GEN: NAD/A&Ox3 ABD: soft, nontender, nondistended  A/P 77 s/p lap chole, now with persistent CBD dilation on CT scan, abdnormal cholangiogram - f/u with Dr. Allen Norris for workup of persistent biliary dilation

## 2015-05-15 NOTE — Patient Instructions (Addendum)
We will have you follow-up with Dr. Allen Norris (Gastroenterologist) in this office on 05/28/15 at 2pm so that he can speak with you about your enlarged common bile duct and give recommendations.

## 2015-05-15 NOTE — Telephone Encounter (Signed)
Received CT results. Reviewed with Dr. Rexene Edison. Patient placed on schedule at 10:45 am today to review results with Physician. Appointment has also been made with Dr. Allen Norris on 05/28/15 to further look into problem.

## 2015-05-19 ENCOUNTER — Emergency Department: Payer: Medicare Other

## 2015-05-19 ENCOUNTER — Encounter: Payer: Self-pay | Admitting: Emergency Medicine

## 2015-05-19 ENCOUNTER — Observation Stay
Admission: EM | Admit: 2015-05-19 | Discharge: 2015-05-20 | Disposition: A | Discharge status: Home / Self Care | Payer: Medicare Other | Attending: Internal Medicine | Admitting: Internal Medicine

## 2015-05-19 DIAGNOSIS — M790 Rheumatism, unspecified: Secondary | ICD-10-CM | POA: Diagnosis not present

## 2015-05-19 DIAGNOSIS — H9311 Tinnitus, right ear: Secondary | ICD-10-CM | POA: Diagnosis not present

## 2015-05-19 DIAGNOSIS — Z87891 Personal history of nicotine dependence: Secondary | ICD-10-CM | POA: Diagnosis not present

## 2015-05-19 DIAGNOSIS — D649 Anemia, unspecified: Secondary | ICD-10-CM | POA: Insufficient documentation

## 2015-05-19 DIAGNOSIS — N189 Chronic kidney disease, unspecified: Secondary | ICD-10-CM | POA: Insufficient documentation

## 2015-05-19 DIAGNOSIS — G894 Chronic pain syndrome: Secondary | ICD-10-CM | POA: Diagnosis not present

## 2015-05-19 DIAGNOSIS — F419 Anxiety disorder, unspecified: Secondary | ICD-10-CM | POA: Diagnosis not present

## 2015-05-19 DIAGNOSIS — M4806 Spinal stenosis, lumbar region: Secondary | ICD-10-CM | POA: Diagnosis not present

## 2015-05-19 DIAGNOSIS — N3020 Other chronic cystitis without hematuria: Secondary | ICD-10-CM | POA: Diagnosis not present

## 2015-05-19 DIAGNOSIS — N3010 Interstitial cystitis (chronic) without hematuria: Secondary | ICD-10-CM | POA: Diagnosis not present

## 2015-05-19 DIAGNOSIS — R339 Retention of urine, unspecified: Secondary | ICD-10-CM | POA: Diagnosis not present

## 2015-05-19 DIAGNOSIS — M549 Dorsalgia, unspecified: Secondary | ICD-10-CM | POA: Diagnosis not present

## 2015-05-19 DIAGNOSIS — R34 Anuria and oliguria: Secondary | ICD-10-CM | POA: Diagnosis not present

## 2015-05-19 DIAGNOSIS — R32 Unspecified urinary incontinence: Secondary | ICD-10-CM | POA: Diagnosis not present

## 2015-05-19 DIAGNOSIS — N179 Acute kidney failure, unspecified: Principal | ICD-10-CM | POA: Insufficient documentation

## 2015-05-19 DIAGNOSIS — H9191 Unspecified hearing loss, right ear: Secondary | ICD-10-CM | POA: Insufficient documentation

## 2015-05-19 DIAGNOSIS — Z882 Allergy status to sulfonamides status: Secondary | ICD-10-CM | POA: Insufficient documentation

## 2015-05-19 DIAGNOSIS — H3530 Unspecified macular degeneration: Secondary | ICD-10-CM | POA: Diagnosis not present

## 2015-05-19 DIAGNOSIS — K589 Irritable bowel syndrome without diarrhea: Secondary | ICD-10-CM | POA: Insufficient documentation

## 2015-05-19 DIAGNOSIS — Z79899 Other long term (current) drug therapy: Secondary | ICD-10-CM | POA: Insufficient documentation

## 2015-05-19 DIAGNOSIS — Z888 Allergy status to other drugs, medicaments and biological substances status: Secondary | ICD-10-CM | POA: Insufficient documentation

## 2015-05-19 DIAGNOSIS — E7800 Pure hypercholesterolemia, unspecified: Secondary | ICD-10-CM | POA: Insufficient documentation

## 2015-05-19 DIAGNOSIS — G4733 Obstructive sleep apnea (adult) (pediatric): Secondary | ICD-10-CM | POA: Insufficient documentation

## 2015-05-19 DIAGNOSIS — K8020 Calculus of gallbladder without cholecystitis without obstruction: Secondary | ICD-10-CM | POA: Insufficient documentation

## 2015-05-19 DIAGNOSIS — M797 Fibromyalgia: Secondary | ICD-10-CM | POA: Diagnosis not present

## 2015-05-19 DIAGNOSIS — N390 Urinary tract infection, site not specified: Secondary | ICD-10-CM | POA: Insufficient documentation

## 2015-05-19 DIAGNOSIS — G4700 Insomnia, unspecified: Secondary | ICD-10-CM | POA: Diagnosis not present

## 2015-05-19 DIAGNOSIS — J45909 Unspecified asthma, uncomplicated: Secondary | ICD-10-CM | POA: Diagnosis not present

## 2015-05-19 DIAGNOSIS — I129 Hypertensive chronic kidney disease with stage 1 through stage 4 chronic kidney disease, or unspecified chronic kidney disease: Secondary | ICD-10-CM | POA: Diagnosis not present

## 2015-05-19 DIAGNOSIS — R0602 Shortness of breath: Secondary | ICD-10-CM | POA: Diagnosis not present

## 2015-05-19 DIAGNOSIS — Z8541 Personal history of malignant neoplasm of cervix uteri: Secondary | ICD-10-CM | POA: Insufficient documentation

## 2015-05-19 DIAGNOSIS — N281 Cyst of kidney, acquired: Secondary | ICD-10-CM | POA: Diagnosis not present

## 2015-05-19 DIAGNOSIS — K5900 Constipation, unspecified: Secondary | ICD-10-CM | POA: Diagnosis not present

## 2015-05-19 DIAGNOSIS — K219 Gastro-esophageal reflux disease without esophagitis: Secondary | ICD-10-CM | POA: Insufficient documentation

## 2015-05-19 DIAGNOSIS — F329 Major depressive disorder, single episode, unspecified: Secondary | ICD-10-CM | POA: Insufficient documentation

## 2015-05-19 DIAGNOSIS — Z880 Allergy status to penicillin: Secondary | ICD-10-CM | POA: Diagnosis not present

## 2015-05-19 DIAGNOSIS — Z85820 Personal history of malignant melanoma of skin: Secondary | ICD-10-CM | POA: Diagnosis not present

## 2015-05-19 DIAGNOSIS — Z91041 Radiographic dye allergy status: Secondary | ICD-10-CM | POA: Insufficient documentation

## 2015-05-19 LAB — URINALYSIS COMPLETE WITH MICROSCOPIC (ARMC ONLY)
Bacteria, UA: NONE SEEN
Bilirubin Urine: NEGATIVE
GLUCOSE, UA: NEGATIVE mg/dL
Hgb urine dipstick: NEGATIVE
Nitrite: NEGATIVE
Protein, ur: NEGATIVE mg/dL
SPECIFIC GRAVITY, URINE: 1.02 (ref 1.005–1.030)
pH: 5 (ref 5.0–8.0)

## 2015-05-19 LAB — BASIC METABOLIC PANEL
ANION GAP: 8 (ref 5–15)
BUN: 30 mg/dL — ABNORMAL HIGH (ref 6–20)
CHLORIDE: 103 mmol/L (ref 101–111)
CO2: 26 mmol/L (ref 22–32)
Calcium: 9 mg/dL (ref 8.9–10.3)
Creatinine, Ser: 1.85 mg/dL — ABNORMAL HIGH (ref 0.44–1.00)
GFR calc Af Amer: 29 mL/min — ABNORMAL LOW (ref >60)
GFR, EST NON AFRICAN AMERICAN: 25 mL/min — AB (ref >60)
GLUCOSE: 104 mg/dL — AB (ref 65–99)
POTASSIUM: 3.7 mmol/L (ref 3.5–5.1)
SODIUM: 137 mmol/L (ref 135–145)

## 2015-05-19 LAB — CBC WITH DIFFERENTIAL/PLATELET
BASOS ABS: 0 10*3/uL (ref 0–0.1)
BASOS PCT: 0 %
EOS ABS: 0.3 10*3/uL (ref 0–0.7)
Eosinophils Relative: 3 %
HCT: 41.2 % (ref 35.0–47.0)
HEMOGLOBIN: 13.9 g/dL (ref 12.0–16.0)
Lymphocytes Relative: 28 %
Lymphs Abs: 2.8 10*3/uL (ref 1.0–3.6)
MCH: 30.3 pg (ref 26.0–34.0)
MCHC: 33.7 g/dL (ref 32.0–36.0)
MCV: 90 fL (ref 80.0–100.0)
Monocytes Absolute: 0.8 10*3/uL (ref 0.2–0.9)
Monocytes Relative: 9 %
NEUTROS PCT: 60 %
Neutro Abs: 5.9 10*3/uL (ref 1.4–6.5)
PLATELETS: 230 10*3/uL (ref 150–440)
RBC: 4.58 MIL/uL (ref 3.80–5.20)
RDW: 12.7 % (ref 11.5–14.5)
WBC: 9.9 10*3/uL (ref 3.6–11.0)

## 2015-05-19 MED ORDER — DEXTROSE 5 % IV SOLN
1.0000 g | Freq: Once | INTRAVENOUS | Status: AC
  Administered 2015-05-19: 1 g via INTRAVENOUS
  Filled 2015-05-19: qty 10

## 2015-05-19 MED ORDER — ACETAMINOPHEN 650 MG RE SUPP: 650.0000 mg | Freq: Four times a day (QID) | RECTAL | Status: DC | PRN

## 2015-05-19 MED ORDER — SODIUM CHLORIDE 0.9 % IV BOLUS (SEPSIS)
1000.0000 mL | Freq: Once | INTRAVENOUS | Status: AC
  Administered 2015-05-19: 1000 mL via INTRAVENOUS

## 2015-05-19 MED ORDER — MORPHINE SULFATE (PF) 2 MG/ML IV SOLN
2.0000 mg | INTRAVENOUS | Status: DC | PRN
  Administered 2015-05-19 – 2015-05-20 (×2): 2 mg via INTRAVENOUS
  Filled 2015-05-19 (×2): qty 1

## 2015-05-19 MED ORDER — ALPRAZOLAM 0.25 MG PO TABS
0.2500 mg | ORAL_TABLET | Freq: Every day | ORAL | Status: DC
  Administered 2015-05-19: 0.25 mg via ORAL
  Filled 2015-05-19: qty 1

## 2015-05-19 MED ORDER — DEXTROSE 5 % IV SOLN
1.0000 g | INTRAVENOUS | Status: DC
  Filled 2015-05-19: qty 10

## 2015-05-19 MED ORDER — BISACODYL 5 MG PO TBEC
5.0000 mg | DELAYED_RELEASE_TABLET | Freq: Two times a day (BID) | ORAL | Status: DC
  Administered 2015-05-19: 5 mg via ORAL
  Filled 2015-05-19: qty 1

## 2015-05-19 MED ORDER — POLYETHYLENE GLYCOL 3350 17 G PO PACK: 17.0000 g | PACK | Freq: Every day | ORAL | Status: DC | PRN

## 2015-05-19 MED ORDER — BENAZEPRIL HCL 20 MG PO TABS: 40.0000 mg | ORAL_TABLET | Freq: Two times a day (BID) | ORAL | Status: DC

## 2015-05-19 MED ORDER — DIPHENHYDRAMINE HCL 25 MG PO CAPS: 25.0000 mg | ORAL_CAPSULE | Freq: Four times a day (QID) | ORAL | Status: DC | PRN

## 2015-05-19 MED ORDER — FLUOXETINE HCL 20 MG PO CAPS: 40.0000 mg | ORAL_CAPSULE | Freq: Every day | ORAL | Status: DC

## 2015-05-19 MED ORDER — DOCUSATE SODIUM 100 MG PO CAPS
100.0000 mg | ORAL_CAPSULE | Freq: Two times a day (BID) | ORAL | Status: DC
  Administered 2015-05-19: 100 mg via ORAL
  Filled 2015-05-19: qty 1

## 2015-05-19 MED ORDER — AMLODIPINE BESYLATE 10 MG PO TABS: 10.0000 mg | ORAL_TABLET | Freq: Every day | ORAL | Status: DC

## 2015-05-19 MED ORDER — HEPARIN SODIUM (PORCINE) 5000 UNIT/ML IJ SOLN
5000.0000 [IU] | Freq: Three times a day (TID) | INTRAMUSCULAR | Status: DC
  Administered 2015-05-19 – 2015-05-20 (×2): 5000 [IU] via SUBCUTANEOUS
  Filled 2015-05-19 (×2): qty 1

## 2015-05-19 MED ORDER — ONDANSETRON HCL 4 MG/2ML IJ SOLN: 4.0000 mg | Freq: Four times a day (QID) | INTRAMUSCULAR | Status: DC | PRN

## 2015-05-19 MED ORDER — BISACODYL 10 MG RE SUPP: 10.0000 mg | Freq: Every day | RECTAL | Status: DC | PRN

## 2015-05-19 MED ORDER — ACETAMINOPHEN 325 MG PO TABS
650.0000 mg | ORAL_TABLET | Freq: Four times a day (QID) | ORAL | Status: DC | PRN
  Administered 2015-05-19: 650 mg via ORAL
  Filled 2015-05-19: qty 2

## 2015-05-19 MED ORDER — FAMOTIDINE 20 MG PO TABS: 40.0000 mg | ORAL_TABLET | Freq: Two times a day (BID) | ORAL | Status: DC | PRN

## 2015-05-19 MED ORDER — OXYCODONE HCL 5 MG PO TABS
10.0000 mg | ORAL_TABLET | Freq: Three times a day (TID) | ORAL | Status: DC | PRN
  Administered 2015-05-19: 10 mg via ORAL
  Filled 2015-05-19: qty 2

## 2015-05-19 MED ORDER — SODIUM CHLORIDE 0.9 % IV SOLN
INTRAVENOUS | Status: DC
  Administered 2015-05-19 (×2): via INTRAVENOUS

## 2015-05-19 MED ORDER — ONDANSETRON HCL 4 MG PO TABS: 4.0000 mg | ORAL_TABLET | Freq: Four times a day (QID) | ORAL | Status: DC | PRN

## 2015-05-19 NOTE — ED Notes (Signed)
Patient states she had CT scan with contrast on Wednesday. States had burning in bladder that day after CT contrast administration. States she was having CT scan to check gallbladder/ducts. Patient states she has been unable to urinate more than a teaspoon at a time. Has h/o polynephritis x3, stricture of right ureter, cystitis.

## 2015-05-19 NOTE — H&P (Signed)
History and Physical    Kathleen Bates ZOX:[id number] DOB: [birth year] DOA: 05/19/2015  Referring physician: Dr. Archie Balboa PCP: Kirk Ruths., MD  Specialists: none  Chief Complaint: anuria  HPI: Kathleen Bates is a 81 y.o. female has a past medical history significant for DDD, HTN, OSA, and recent cholecystectomy now with acute renal failure and anuria. Had an abdominal CT with contrast done 5 days ago. In ER, Cr=1.9(baseline 0.6), UA shows WBC's TNTC. She c/o back and groin pain. She is now admitted.  Review of Systems: The patient denies anorexia, fever, weight loss,, vision loss, decreased hearing, hoarseness, chest pain, syncope, dyspnea on exertion, peripheral edema, balance deficits, hemoptysis,  melena, hematochezia, severe indigestion/heartburn, hematuria, incontinence, genital sores, muscle weakness, suspicious skin lesions, transient blindness, difficulty walking, depression, unusual weight change, abnormal bleeding, enlarged lymph nodes, angioedema, and breast masses.   Past Medical History  Diagnosis Date  . Hypertension   . 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 Status post excision of acoustic neuroma 1988    RESIDUAL--  RIGHT HEARING LOSS, MILD IMPAIRED BALANCE, AND DRY MOUTH  . Balance problem MILD-- POST ACOUSTIC NEUROMA  . Dry mouth     POST ACOUSTIC NEUROMA  . Interstitial cystitis   . Chronic cystitis   . Incontinence of urine   . Frequency of urination   . Urgency of urination   . Nocturia   . Chronic back pain     CHRONIC NARCTIC -- GOES TO PAIN CLINIC  . SOB (shortness of breath) on exertion   . Constipated   . Fibromyalgia   . Lumbar stenosis   . Cataract immature BILATERAL  . Macular degeneration of both eyes   . Insomnia   . Anxiety   . Depression   . History of cervical cancer S/P VAG. HYSTECTOMY AGE 87  . Hypercholesteremia   . IBS (irritable bowel syndrome)   . Hemorrhoids   . Anemia   . GERD (gastroesophageal reflux  disease)   . Ringing in ear     right  . Chronic kidney disease     polynephritis/freq. uti  . Cancer [facility])    Past Surgical History  Procedure Laterality Date  . Total knee arthroplasty  2006    LEFT  . Replacement total knee  1999    RIGHT  . Revision total knee arthroplasty  X2    RIGHT  . Cervical conization w/bx  AGE 45    POST REPAIR OF BLEEDING  . Dilation and curettage of uterus  AGE 45  . Vaginal hysterectomy  AGE 45  . Appendectomy  AGE  28  . Cystoscopy with urethral dilatation  AGE 72  . Tubal ligation  AGE 20  . Tonsillectomy and adenoidectomy  AGE 44  . Lipoma resection  1972    EXTENSIVE RIGHT HIP/THIGH  . Anterior and posterior repair  1973  . Neuroplasty / transposition median nerve at carpal tunnel bilateral  1980  . Craniectomy for excision of acoustic neuroma  1988  . Melanoma excision  1992    LEFT SHOULDER  . Cysto/ [place]  . Pubovaginal sling  1998  . Lumbar spine surgery  [place]    no metal  . Cystoscopy/retrograde/ureteroscopy  07/24/2011    Procedure: CYSTOSCOPY/RETROGRADE/URETEROSCOPY;  Surgeon: Ailene Rud, MD;  Location: [facility];  Service: Urology;  Laterality: Right;  Cystoscopy Right Ureteroscopy Bilateral RPG  C-ARM needed  **  Patient has multiple allergies to medications**  **HX of A-fib & HTN**  . Cystoscopy with hydrodistension and biopsy  07/24/2011    Procedure: CYSTOSCOPY/BIOPSY/HYDRODISTENSION;  Surgeon: Ailene Rud, MD;  Location: [facility];  Service: Urology;  Laterality: Right;  HOD Poss Biopsy of Rt Ureteral Lesion Meatal Dilation   . Breast cyst excision Left AGE 72  . Joint replacement Bilateral 2008    right knee revision x 3.   . Cholecystectomy N/A 04/01/2015    Procedure: LAPAROSCOPIC CHOLECYSTECTOMY WITH INTRAOPERATIVE CHOLANGIOGRAM;  Surgeon: Marlyce Huge, MD;  Location: [facility];  Service: General;  Laterality: N/A;   Social  History:  reports that she quit smoking about 23 years ago. Her smoking use included Cigarettes. She smoked 1.50 packs per day. She has never used smokeless tobacco. She reports that she drinks alcohol. She reports that she does not use illicit drugs.  Allergies  Allergen Reactions  . Cobalamine Combinations Swelling    Swelling in throat and the instestinal tract. Swelling in throat and the instestinal tract.  . Iodinated Diagnostic Agents Hives    Reacted to IVP dye a long time ago.  Has had several prior to this and did not react to the prior.  Reaction was in 1970's  . Ioxaglate Hives    Reacted to IVP dye a long time ago.  Has had several prior to this and did not react to the prior.  Reaction was in 1970's  . Amitriptyline Palpitations    Affects heart rate, flutter, skip beats  . Butalbital-Aspirin-Caffeine Nausea Only  . Furacin [Nitrofurazone] Rash    TOPICAL  . Penicillins Rash  . Sulfa Antibiotics Swelling and Rash    Has patient had a PCN reaction causing immediate rash, facial/tongue/throat swelling, SOB or lightheadedness with hypotension: Yes Has patient had a PCN reaction causing severe rash involving mucus membranes or skin necrosis: No Has patient had a PCN reaction that required hospitalization Yes Has patient had a PCN reaction occurring within the last 10 years: No If all of the above answers are "NO", then may proceed with Cephalosporin use.     Family History  Problem Relation Age of Onset  . Hypertension Mother   . Stroke Mother   . Cancer Father     Prior to Admission medications   Medication Sig Start Date End Date Taking? Authorizing Provider  acetaminophen (TYLENOL) 325 MG tablet Take 325 mg by mouth.    Historical Provider, MD  ALPRAZolam Duanne Moron) 0.25 MG tablet Take 0.25 mg by mouth 2 (two) times daily as needed for anxiety.     Historical Provider, MD  amLODipine (NORVASC) 10 MG tablet Take 10 mg by mouth daily.     Historical Provider, MD   benazepril (LOTENSIN) 40 MG tablet TAKE 1 TABLET TWICE DAILY 04/15/15   Historical Provider, MD  bisacodyl (DULCOLAX) 5 MG EC tablet Take 5 mg by mouth daily as needed for mild constipation or moderate constipation.  03/23/12   Historical Provider, MD  diphenhydrAMINE (BENADRYL) 25 mg capsule Take 25 mg by mouth every 6 (six) hours as needed for allergies.  03/23/12   Historical Provider, MD  famotidine (PEPCID) 40 MG tablet Take 40 mg by mouth 2 (two) times daily as needed (acid reflux).    Historical Provider, MD  FLUoxetine (PROZAC) 40 MG capsule Take 40 mg by mouth daily.     Historical Provider, MD  OxyCODONE (OXYCONTIN) 40 mg T12A 12 hr tablet Take 40 mg by mouth every  8 (eight) hours. Takes twice a day, then a 30 mg once a day when need litte.    Historical Provider, MD  Oxycodone HCl 10 MG TABS Take 10 mg by mouth 3 (three) times daily as needed (breakthrough pain).  02/15/15   Historical Provider, MD  OxyCODONE HCl ER (OXYCONTIN) 30 MG T12A Take 30 mg by mouth. Takes once a day, in addition to oxycontin 40 mg twice a day    Historical Provider, MD   Physical Exam: Filed Vitals:   05/19/15 1032 05/19/15 1141 05/19/15 1200  BP: 100/54 104/60 100/57  Pulse: 76  72  Temp: 97.9 F (36.6 C)    Resp: 20    Height: 5\' 8"  (1.727 m)    Weight: 83.915 kg (185 lb)    SpO2: 94%  93%     General:  No apparent distress  Eyes: PERRL, EOMI, no scleral icterus  ENT: moist oropharynx  Neck: supple, no lymphadenopathy  Cardiovascular: regular rate without MRG; 2+ peripheral pulses, no JVD, no peripheral edema  Respiratory: CTA biL, good air movement without wheezing, rhonchi or crackled  Abdomen: soft, non tender to palpation, positive bowel sounds, no guarding, no rebound  Skin: no rashes  Musculoskeletal: normal bulk and tone, no joint swelling  Psychiatric: normal mood and affect  Neurologic: CN 2-12 grossly intact, MS 5/5 in all 4  Labs on Admission:  Basic Metabolic  Panel:  Recent Labs Lab 05/14/15 1327 05/19/15 1107  NA  --  137  K  --  3.7  CL  --  103  CO2  --  26  GLUCOSE  --  104*  BUN  --  30*  CREATININE 0.60 1.85*  CALCIUM  --  9.0   Liver Function Tests: No results for input(s): AST, ALT, ALKPHOS, BILITOT, PROT, ALBUMIN in the last 168 hours. No results for input(s): LIPASE, AMYLASE in the last 168 hours. No results for input(s): AMMONIA in the last 168 hours. CBC:  Recent Labs Lab 05/19/15 1107  WBC 9.9  NEUTROABS 5.9  HGB 13.9  HCT 41.2  MCV 90.0  PLT 230   Cardiac Enzymes: No results for input(s): CKTOTAL, CKMB, CKMBINDEX, TROPONINI in the last 168 hours.  BNP (last 3 results) No results for input(s): BNP in the last 8760 hours.  ProBNP (last 3 results) No results for input(s): PROBNP in the last 8760 hours.  CBG: No results for input(s): GLUCAP in the last 168 hours.  Radiological Exams on Admission: No results found.  EKG: Independently reviewed.  Assessment/Plan Active Problems:   ARF (acute renal failure) (HCC)   Anuria and oliguria   Will observe on floor with IV fluids and IV ABX. Order renal US. Consult Nephrology and Urology. F/u labs in AM.  Diet: clear liquids Fluids: NS@100  DVT Prophylaxis: SQ Heparin  Code Status: FULL  Family Communication: yes  Disposition Plan: home  Time spent: 45 min

## 2015-05-19 NOTE — ED Notes (Signed)
Denies tenderness on palpation to suprapubic area/abdomen.

## 2015-05-19 NOTE — ED Provider Notes (Signed)
[facility] Emergency Department Provider Note   ____________________________________________  Time seen: 1115  I have reviewed the triage vital signs and the nursing notes.   HISTORY  Chief Complaint Urinary Retention   History limited by: Not Limited   HPI Kathleen Bates is a 81 y.o. female who presents to the emergency department today because of concerns for decreased urination. Patient states that 4 days ago she underwent a CT scan to further evaluate her biliary tree. She states she had felt fine until yesterday. Starting yesterday afternoon she started having decreased urination. She states she would urinate roughly a teaspoon. She denies any change in her drinking or eating during this time. She also denies the urge her feeling that she needs to urinate. When she was urinating after the CT scan she denied any change in odor or color of the urine. Denies any fevers.  Past Medical History  Diagnosis Date  . Hypertension   . 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 Status post excision of acoustic neuroma 1988    RESIDUAL--  RIGHT HEARING LOSS, MILD IMPAIRED BALANCE, AND DRY MOUTH  . Balance problem MILD-- POST ACOUSTIC NEUROMA  . Dry mouth     POST ACOUSTIC NEUROMA  . Interstitial cystitis   . Chronic cystitis   . Incontinence of urine   . Frequency of urination   . Urgency of urination   . Nocturia   . Chronic back pain     CHRONIC NARCTIC -- GOES TO PAIN CLINIC  . SOB (shortness of breath) on exertion   . Constipated   . Fibromyalgia   . Lumbar stenosis   . Cataract immature BILATERAL  . Macular degeneration of both eyes   . Insomnia   . Anxiety   . Depression   . History of cervical cancer S/P VAG. HYSTECTOMY AGE 62  . Hypercholesteremia   . IBS (irritable bowel syndrome)   . Hemorrhoids   . Anemia   . GERD (gastroesophageal reflux disease)   . Ringing in ear     right  . Chronic kidney disease      polynephritis/freq. uti  . Cancer [facility])     Patient Active Problem List   Diagnosis Date Noted  . Chronic LBP 05/10/2015  . Chronic pain associated with significant psychosocial dysfunction 05/10/2015  . Encounter for other administrative examinations 05/10/2015  . Chronic interstitial cystitis 04/19/2015  . Cholelithiasis   . Hemorrhoid 10/09/2014  . H/O Malignant melanoma 10/09/2014  . Spinal stenosis 10/09/2014  . History of repair of rotator cuff 06/25/2014  . Complete rotator cuff rupture of left shoulder 06/15/2014  . DDD (degenerative disc disease), cervical 05/09/2014  . Airway hyperreactivity 04/03/2014  . Benign hypertension 04/03/2014  . Acid reflux 04/03/2014  . Hypercholesteremia 04/03/2014  . Fibrositis 05/24/2013  . Fibromyalgia 05/24/2013  . Apnea, sleep 01/31/2003    Past Surgical History  Procedure Laterality Date  . Total knee arthroplasty  2006    LEFT  . Replacement total knee  1999    RIGHT  . Revision total knee arthroplasty  X2    RIGHT  . Cervical conization w/bx  AGE 62    POST REPAIR OF BLEEDING  . Dilation and curettage of uterus  AGE 62  . Vaginal hysterectomy  AGE 62  . Appendectomy  AGE  41  . Cystoscopy with urethral dilatation  AGE 27  . Tubal ligation  AGE 49  . Tonsillectomy and adenoidectomy  AGE 21  . Lipoma resection  1972    EXTENSIVE RIGHT HIP/THIGH  . Anterior and posterior repair  1973  . Neuroplasty / transposition median nerve at carpal tunnel bilateral  1980  . Craniectomy for excision of acoustic neuroma  1988  . Melanoma excision  1992    LEFT SHOULDER  . Cysto/ [place]  . Pubovaginal sling  1998  . Lumbar spine surgery  [place]    no metal  . Cystoscopy/retrograde/ureteroscopy  07/24/2011    Procedure: CYSTOSCOPY/RETROGRADE/URETEROSCOPY;  Surgeon: Ailene Rud, MD;  Location: [facility];  Service: Urology;  Laterality: Right;  Cystoscopy Right Ureteroscopy Bilateral  RPG  C-ARM needed  **Patient has multiple allergies to medications**  **HX of A-fib & HTN**  . Cystoscopy with hydrodistension and biopsy  07/24/2011    Procedure: CYSTOSCOPY/BIOPSY/HYDRODISTENSION;  Surgeon: Ailene Rud, MD;  Location: [facility];  Service: Urology;  Laterality: Right;  HOD Poss Biopsy of Rt Ureteral Lesion Meatal Dilation   . Breast cyst excision Left AGE 45  . Joint replacement Bilateral 2008    right knee revision x 3.   . Cholecystectomy N/A 04/01/2015    Procedure: LAPAROSCOPIC CHOLECYSTECTOMY WITH INTRAOPERATIVE CHOLANGIOGRAM;  Surgeon: Marlyce Huge, MD;  Location: [facility] ORS;  Service: General;  Laterality: N/A;    Current Outpatient Rx  Name  Route  Sig  Dispense  Refill  . acetaminophen (TYLENOL) 325 MG tablet   Oral   Take 325 mg by mouth.         . ALPRAZolam (XANAX) 0.25 MG tablet   Oral   Take 0.25 mg by mouth 2 (two) times daily as needed for anxiety.          Marland Kitchen amLODipine (NORVASC) 10 MG tablet   Oral   Take 10 mg by mouth daily.          . benazepril (LOTENSIN) 40 MG tablet      TAKE 1 TABLET TWICE DAILY         . bisacodyl (DULCOLAX) 5 MG EC tablet   Oral   Take 5 mg by mouth daily as needed for mild constipation or moderate constipation.          . diphenhydrAMINE (BENADRYL) 25 mg capsule   Oral   Take 25 mg by mouth every 6 (six) hours as needed for allergies.          . famotidine (PEPCID) 40 MG tablet   Oral   Take 40 mg by mouth 2 (two) times daily as needed (acid reflux).         Marland Kitchen FLUoxetine (PROZAC) 40 MG capsule   Oral   Take 40 mg by mouth daily.          . OxyCODONE (OXYCONTIN) 40 mg T12A 12 hr tablet   Oral   Take 40 mg by mouth every 8 (eight) hours. Takes twice a day, then a 30 mg once a day when need litte.         . Oxycodone HCl 10 MG TABS   Oral   Take 10 mg by mouth 3 (three) times daily as needed (breakthrough pain).          . OxyCODONE HCl ER  (OXYCONTIN) 30 MG T12A   Oral   Take 30 mg by mouth. Takes once a day, in addition to oxycontin 40 mg twice a day  Allergies Cobalamine combinations; Iodinated diagnostic agents; Ioxaglate; Amitriptyline; Butalbital-aspirin-caffeine; Furacin; Penicillins; and Sulfa antibiotics  Family History  Problem Relation Age of Onset  . Hypertension Mother   . Stroke Mother   . Cancer Father     Social History Social History  Substance Use Topics  . Smoking status: Former Smoker -- 1.50 packs/day    Types: Cigarettes    Quit date: 07/22/1991  . Smokeless tobacco: Never Used  . Alcohol Use: Yes     Comment: RARE    Review of Systems  Constitutional: Negative for fever. Cardiovascular: Negative for chest pain. Respiratory: Negative for shortness of breath. Gastrointestinal: Negative for abdominal pain, vomiting and diarrhea. Genitourinary: Negative for dysuria. Musculoskeletal: Negative for back pain. Skin: Negative for rash. Neurological: Negative for headaches, focal weakness or numbness.  10-point ROS otherwise negative.  ____________________________________________   PHYSICAL EXAM:  VITAL SIGNS: ED Triage Vitals  Enc Vitals Group     BP 05/19/15 1032 100/54 mmHg     Pulse Rate 05/19/15 1032 76     Resp 05/19/15 1032 20     Temp 05/19/15 1032 97.9 F (36.6 C)     Temp src --      SpO2 05/19/15 1032 94 %     Weight 05/19/15 1032 185 lb (83.915 kg)     Height 05/19/15 1032 5\' 8"  (1.727 m)     Head Cir --      Peak Flow --      Pain Score 05/19/15 1044 2   Constitutional: Alert and oriented. Well appearing and in no distress. Eyes: Conjunctivae are normal. PERRL. Normal extraocular movements. ENT   Head: Normocephalic and atraumatic.   Nose: No congestion/rhinnorhea.   Mouth/Throat: Mucous membranes are moist.   Neck: No stridor. Hematological/Lymphatic/Immunilogical: No cervical lymphadenopathy. Cardiovascular: Normal rate, regular  rhythm.  No murmurs, rubs, or gallops. Respiratory: Normal respiratory effort without tachypnea nor retractions. Breath sounds are clear and equal bilaterally. No wheezes/rales/rhonchi. Gastrointestinal: Soft and nontender. No distention.  Genitourinary: Deferred Musculoskeletal: Normal range of motion in all extremities. No joint effusions.  No lower extremity tenderness nor edema. Neurologic:  Normal speech and language. No gross focal neurologic deficits are appreciated. Speech is normal.  Skin:  Skin is warm, dry and intact. No rash noted. Psychiatric: Mood and affect are normal. Speech and behavior are normal. Patient exhibits appropriate insight and judgment.  ____________________________________________    LABS (pertinent positives/negatives)  Labs Reviewed  BASIC METABOLIC PANEL - Abnormal; Notable for the following:    Glucose, Bld 104 (*)    BUN 30 (*)    Creatinine, Ser 1.85 (*)    GFR calc non Af Amer 25 (*)    GFR calc Af Amer 29 (*)    All other components within normal limits  URINALYSIS COMPLETEWITH MICROSCOPIC ([facility] ONLY) - Abnormal; Notable for the following:    Color, Urine YELLOW (*)    APPearance CLOUDY (*)    Ketones, ur TRACE (*)    Leukocytes, UA 3+ (*)    Squamous Epithelial / LPF 0-5 (*)    All other components within normal limits  CBC WITH DIFFERENTIAL/PLATELET     ____________________________________________   EKG  None  ____________________________________________    RADIOLOGY  None   ____________________________________________   PROCEDURES  Procedure(s) performed: None  Critical Care performed: No  ____________________________________________   INITIAL IMPRESSION / ASSESSMENT AND PLAN / ED COURSE  Pertinent labs & imaging results that were available during my care of the patient were  reviewed by me and considered in my medical decision making (see chart for details).  Patient presented to the emergency department today  with concerns for decreased urination. Patient did undergo CT scan 4 days ago. Patient's creatinine is elevated concerning for IV contrast dye injury. Additionally the patient's urine is concerning for urinary tract infection. At this point no findings concerning for sepsis. Will plan on IV hydration, antibiotics and admission to the hospital.  ____________________________________________   FINAL CLINICAL IMPRESSION(S) / ED DIAGNOSES  Final diagnoses:  UTI (lower urinary tract infection)  Acute kidney injury ([place])     Nance Pear, MD 05/19/15 1230

## 2015-05-20 DIAGNOSIS — N179 Acute kidney failure, unspecified: Secondary | ICD-10-CM | POA: Diagnosis not present

## 2015-05-20 LAB — BASIC METABOLIC PANEL
Anion gap: 8 (ref 5–15)
BUN: 14 mg/dL (ref 6–20)
CALCIUM: 8.3 mg/dL — AB (ref 8.9–10.3)
CHLORIDE: 108 mmol/L (ref 101–111)
CO2: 25 mmol/L (ref 22–32)
CREATININE: 0.75 mg/dL (ref 0.44–1.00)
GFR calc non Af Amer: >60 mL/min (ref >60)
Glucose, Bld: 106 mg/dL — ABNORMAL HIGH (ref 65–99)
Potassium: 4 mmol/L (ref 3.5–5.1)
SODIUM: 141 mmol/L (ref 135–145)

## 2015-05-20 LAB — CBC
HCT: 38.6 % (ref 35.0–47.0)
HEMOGLOBIN: 12.9 g/dL (ref 12.0–16.0)
MCH: 30.1 pg (ref 26.0–34.0)
MCHC: 33.4 g/dL (ref 32.0–36.0)
MCV: 90 fL (ref 80.0–100.0)
Platelets: 174 10*3/uL (ref 150–440)
RBC: 4.29 MIL/uL (ref 3.80–5.20)
RDW: 12.9 % (ref 11.5–14.5)
WBC: 5.7 10*3/uL (ref 3.6–11.0)

## 2015-05-20 MED ORDER — CEFUROXIME AXETIL 250 MG PO TABS: 250.0000 mg | ORAL_TABLET | Freq: Two times a day (BID) | ORAL | Status: DC

## 2015-05-20 NOTE — Consult Note (Signed)
Kathleen Bates is a 81yo with a hx of urinary retention who was admitted yesterday with ARF and anuria. She underwent CT scan with contrast on 10/11 and since the scan here urine output has been decreasing. She presented to the ER and her foley catheter was removed. Since she has been admitted to the floor she has been urinating without difficulty with PVRs of 10-30cc. No need for foley catheter at this point in time.  Kathleen Bates can followup with Urology 2-3 weeks after discharge  Demar Shad, [place]

## 2015-05-20 NOTE — Progress Notes (Signed)
Pt d/c to home today.  IV removed intact.  Rx's given to pt w/all questions and concerns addressed.  D/C paperwork reviewed and education provided with all questions and concerns addressed.  Pt husband at bedside for home transport.  Volunteer services contact for transportation from room to exit.

## 2015-05-20 NOTE — Discharge Summary (Signed)
[place] [facility] [place] NAME: Kathleen Bates    MR#:  [id number]  DATE OF BIRTH:  06/17/1934  DATE OF ADMISSION:  05/19/2015 ADMITTING PHYSICIAN: Idelle Crouch, MD  DATE OF DISCHARGE: 05/20/2015 10:38 AM  PRIMARY CARE PHYSICIAN: Kirk Ruths., MD    ADMISSION DIAGNOSIS:  Acute renal failure (HCC) [N17.9] UTI (lower urinary tract infection) [N39.0] Acute kidney injury ([place]) [N17.9]  DISCHARGE DIAGNOSIS:  Active Problems:   ARF (acute renal failure) (HCC)   Anuria and oliguria   SECONDARY DIAGNOSIS:   Past Medical History  Diagnosis Date  . Hypertension   . 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 Status post excision of acoustic neuroma 1988    RESIDUAL--  RIGHT HEARING LOSS, MILD IMPAIRED BALANCE, AND DRY MOUTH  . Balance problem MILD-- POST ACOUSTIC NEUROMA  . Dry mouth     POST ACOUSTIC NEUROMA  . Interstitial cystitis   . Chronic cystitis   . Incontinence of urine   . Frequency of urination   . Urgency of urination   . Nocturia   . Chronic back pain     CHRONIC NARCTIC -- GOES TO PAIN CLINIC  . SOB (shortness of breath) on exertion   . Constipated   . Fibromyalgia   . Lumbar stenosis   . Cataract immature BILATERAL  . Macular degeneration of both eyes   . Insomnia   . Anxiety   . Depression   . History of cervical cancer S/P VAG. HYSTECTOMY AGE 22  . Hypercholesteremia   . IBS (irritable bowel syndrome)   . Hemorrhoids   . Anemia   . GERD (gastroesophageal reflux disease)   . Ringing in ear     right  . Chronic kidney disease     polynephritis/freq. uti  . Cancer [facility])     HOSPITAL COURSE:  This is a 81 year old female who presented with urinary retention and acute renal failure. For further details as per H&P.  1. Acute renal failure: Patient was hydrated with IV fluids and her creatinine is back to baseline. Her acute renal failure was thought to be secondary to CT scan with  contrast on October 11. 2. Anuria: Patient no longer has any area. She is making adequate urine. 3. Urinary tract infection: Patient will be transitioned to oral Ceftin at discharge.  4. Anxiety and depression: Continue present medications including Xanax and Prozac.  DISCHARGE CONDITIONS AND DIET:  She is being discharged home in stable condition on a heart healthy diet  CONSULTS OBTAINED:     DRUG ALLERGIES:   Allergies  Allergen Reactions  . Cobalamine Combinations Swelling    Swelling in throat and the instestinal tract. Swelling in throat and the instestinal tract.  . Iodinated Diagnostic Agents Hives    Reacted to IVP dye a long time ago.  Has had several prior to this and did not react to the prior.  Reaction was in 1970's  . Ioxaglate Hives    Reacted to IVP dye a long time ago.  Has had several prior to this and did not react to the prior.  Reaction was in 1970's  . Amitriptyline Palpitations    Affects heart rate, flutter, skip beats  . Butalbital-Aspirin-Caffeine Nausea Only  . Furacin [Nitrofurazone] Rash    TOPICAL  . Penicillins Rash  . Sulfa Antibiotics Swelling and Rash    Has patient had a PCN reaction causing immediate rash, facial/tongue/throat swelling, SOB  or lightheadedness with hypotension: Yes Has patient had a PCN reaction causing severe rash involving mucus membranes or skin necrosis: No Has patient had a PCN reaction that required hospitalization Yes Has patient had a PCN reaction occurring within the last 10 years: No If all of the above answers are "NO", then may proceed with Cephalosporin use.     DISCHARGE MEDICATIONS:   Discharge Medication List as of 05/20/2015 10:18 AM    START taking these medications   Details  cefUROXime (CEFTIN) 250 MG tablet Take 1 tablet (250 mg total) by mouth 2 (two) times daily with a meal., Starting 05/20/2015, Until Discontinued, Normal      CONTINUE these medications which have NOT CHANGED   Details   acetaminophen (TYLENOL) 325 MG tablet Take 650 mg by mouth every 6 (six) hours as needed for headache. , Until Discontinued, Historical Med    ALPRAZolam (XANAX) 0.25 MG tablet Take 0.25 mg by mouth at bedtime. , Until Discontinued, Historical Med    amLODipine (NORVASC) 10 MG tablet Take 10 mg by mouth daily. , Until Discontinued, Historical Med    benazepril (LOTENSIN) 40 MG tablet TAKE 1 TABLET TWICE DAILY, Historical Med    bisacodyl (DULCOLAX) 5 MG EC tablet Take 5 mg by mouth 2 (two) times daily. , Starting 03/23/2012, Until Discontinued, Historical Med    diphenhydrAMINE (BENADRYL) 25 mg capsule Take 25 mg by mouth every 6 (six) hours as needed for allergies. , Starting 03/23/2012, Until Discontinued, Historical Med    famotidine (PEPCID) 40 MG tablet Take 40 mg by mouth 2 (two) times daily as needed for heartburn or indigestion (acid reflux). , Until Discontinued, Historical Med    FLUoxetine (PROZAC) 40 MG capsule Take 40 mg by mouth daily. , Until Discontinued, Historical Med    !! OxyCODONE (OXYCONTIN) 40 mg T12A 12 hr tablet Take 40 mg by mouth 2 (two) times daily. Takes twice a day, then a 30 mg once a day when need litte., Until Discontinued, Historical Med    Oxycodone HCl 10 MG TABS Take 10 mg by mouth 3 (three) times daily as needed (breakthrough pain). , Starting 02/15/2015, Until Discontinued, Historical Med    !! OxyCODONE HCl ER (OXYCONTIN) 30 MG T12A Take 30 mg by mouth daily. Takes in addition to oxycontin 40 mg twice a day, Until Discontinued, Historical Med    POLYETHYLENE GLYCOL 3350 PO Take 17 g by mouth daily as needed (for constipation.)., Until Discontinued, Historical Med     !! - Potential duplicate medications found. Please discuss with provider.            Today   CHIEF COMPLAINT:  Patient is doing well this point. Patient is making adequate urine output. Patient denies nausea, vomiting or chest pain.   VITAL SIGNS:  Blood pressure 128/72,  pulse 78, temperature 98.1 F (36.7 C), temperature source Oral, resp. rate 18, height 5\' 8"  (1.727 m), weight 83.915 kg (185 lb), SpO2 96 %.   REVIEW OF SYSTEMS:  Review of Systems  Constitutional: Negative for fever, chills and malaise/fatigue.  HENT: Negative for sore throat.   Eyes: Negative for blurred vision.  Respiratory: Negative for cough, hemoptysis, shortness of breath and wheezing.   Cardiovascular: Negative for chest pain, palpitations and leg swelling.  Gastrointestinal: Negative for nausea, vomiting, abdominal pain, diarrhea and blood in stool.  Genitourinary: Negative for dysuria.  Musculoskeletal: Negative for back pain.  Neurological: Negative for dizziness, tremors and headaches.  Endo/Heme/Allergies: Does not bruise/bleed easily.  PHYSICAL EXAMINATION:  GENERAL:  81 y.o.-year-old patient lying in the bed with no acute distress.  NECK:  Supple, no jugular venous distention. No thyroid enlargement, no tenderness.  LUNGS: Normal breath sounds bilaterally, no wheezing, rales,rhonchi  No use of accessory muscles of respiration.  CARDIOVASCULAR: S1, S2 normal. No murmurs, rubs, or gallops.  ABDOMEN: Soft, non-tender, non-distended. Bowel sounds present. No organomegaly or mass.  EXTREMITIES: No pedal edema, cyanosis, or clubbing.  PSYCHIATRIC: The patient is alert and oriented x 3.  SKIN: No obvious rash, lesion, or ulcer.   DATA REVIEW:   CBC  Recent Labs Lab 05/20/15 0619  WBC 5.7  HGB 12.9  HCT 38.6  PLT 174    Chemistries   Recent Labs Lab 05/20/15 0619  NA 141  K 4.0  CL 108  CO2 25  GLUCOSE 106*  BUN 14  CREATININE 0.75  CALCIUM 8.3*    Cardiac Enzymes No results for input(s): TROPONINI in the last 168 hours.  Microbiology Results  @[email or web address] @  RADIOLOGY:  US Renal  05/19/2015  CLINICAL DATA:  Acute renal failure. EXAM: RENAL / URINARY TRACT ULTRASOUND COMPLETE COMPARISON:  02/28/2015 FINDINGS: Right Kidney: Length: 11.6 cm.  Echogenicity within normal limits. No mass or hydronephrosis visualized. Left Kidney: Length: 12.1 cm. Echogenicity within normal limits. No mass or hydronephrosis visualized. 3 cm left lower pole renal cyst again noted. Bladder: Appears normal for degree of bladder distention. IMPRESSION: Normal size kidneys.  No evidence of hydronephrosis. Electronically Signed   By: Earle Gell M.D.   On: 05/19/2015 13:29      Management plans discussed with the patient and she is in agreement. Stable for discharge home  Patient should follow up with PCP in a week  CODE STATUS:     Code Status Orders        Start     Ordered   05/19/15 1409  Full code   Continuous     05/19/15 1408      TOTAL TIME TAKING CARE OF THIS PATIENT: 35 minutes.    Note: This dictation was prepared with Dragon dictation along with smaller phrase technology. Any transcriptional errors that result from this process are unintentional.  Khyree Carillo M.D on 05/20/2015 at 12:25 PM  Between 7am to 6pm - Pager - [phone] After 6pm go to [email or web address] - password [email or web address] [facility]  Office  [phone]  CC: Primary care physician; Kirk Ruths., MD

## 2015-05-24 ENCOUNTER — Other Ambulatory Visit: Payer: Self-pay

## 2015-05-28 ENCOUNTER — Ambulatory Visit (INDEPENDENT_AMBULATORY_CARE_PROVIDER_SITE_OTHER): Payer: Medicare Other | Admitting: Gastroenterology

## 2015-05-28 ENCOUNTER — Encounter: Payer: Self-pay | Admitting: Gastroenterology

## 2015-05-28 ENCOUNTER — Other Ambulatory Visit: Payer: Self-pay

## 2015-05-28 VITALS — BP 153/72 | HR 83 | Temp 98.1°F | Wt 182.0 lb

## 2015-05-28 DIAGNOSIS — R933 Abnormal findings on diagnostic imaging of other parts of digestive tract: Secondary | ICD-10-CM | POA: Diagnosis not present

## 2015-05-28 NOTE — Progress Notes (Signed)
Gastroenterology Consultation  Referring Provider:     Kirk Ruths, MD Primary Care Physician:  Kathleen Bates., MD Primary Gastroenterologist:  Dr. Allen Norris     Reason for Consultation:     Dilated Common bile duct        HPI:   Kathleen Bates is a 81 y.o. y/o female referred for consultation & management of  Dilated common bile duct by Dr. Kirk Bates., MD.   This patient comes today with a report of being found to have a dilated common bile duct. The patient reports that after having her gallbladder out she had some abdominal pain which resulted in her getting a CT scan. The patient CT scan showed her to have a dilated, bile duct all the way down to the ampulla. It was suggested that the patient may have a lesion at the distal common bile duct or ampulla as the cause of her obstruction. The patient states that she is very apprehensive about doing any further procedures after having renal failure from contrast I from her recent CT scan. The patient also reports that she has been having pain in her shoulder since having the flu shot requiring her to take steroids.  Past Medical History  Diagnosis Date  . Hypertension   . 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 Status post excision of acoustic neuroma 1988    RESIDUAL--  RIGHT HEARING LOSS, MILD IMPAIRED BALANCE, AND DRY MOUTH  . Balance problem MILD-- POST ACOUSTIC NEUROMA  . Dry mouth     POST ACOUSTIC NEUROMA  . Interstitial cystitis   . Chronic cystitis   . Incontinence of urine   . Frequency of urination   . Urgency of urination   . Nocturia   . Chronic back pain     CHRONIC NARCTIC -- GOES TO PAIN CLINIC  . SOB (shortness of breath) on exertion   . Constipated   . Fibromyalgia   . Lumbar stenosis   . Cataract immature BILATERAL  . Macular degeneration of both eyes   . Insomnia   . Anxiety   . Depression   . History of cervical cancer S/P VAG. HYSTECTOMY AGE 24  . Hypercholesteremia     . IBS (irritable bowel syndrome)   . Hemorrhoids   . Anemia   . GERD (gastroesophageal reflux disease)   . Ringing in ear     right  . Chronic kidney disease     polynephritis/freq. uti  . Cancer [facility])     Past Surgical History  Procedure Laterality Date  . Total knee arthroplasty  2006    LEFT  . Replacement total knee  1999    RIGHT  . Revision total knee arthroplasty  X2    RIGHT  . Cervical conization w/bx  AGE 37    POST REPAIR OF BLEEDING  . Dilation and curettage of uterus  AGE 37  . Vaginal hysterectomy  AGE 37  . Appendectomy  AGE  48  . Cystoscopy with urethral dilatation  AGE 51  . Tubal ligation  AGE 89  . Tonsillectomy and adenoidectomy  AGE 50  . Lipoma resection  1972    EXTENSIVE RIGHT HIP/THIGH  . Anterior and posterior repair  1973  . Neuroplasty / transposition median nerve at carpal tunnel bilateral  1980  . Craniectomy for excision of acoustic neuroma  1988  . Melanoma excision  1992    LEFT SHOULDER  . Cysto/ hod  [place]  . Pubovaginal sling  1998  . Lumbar spine surgery  [place]    no metal  . Cystoscopy/retrograde/ureteroscopy  07/24/2011    Procedure: CYSTOSCOPY/RETROGRADE/URETEROSCOPY;  Surgeon: Ailene Rud, MD;  Location: [facility];  Service: Urology;  Laterality: Right;  Cystoscopy Right Ureteroscopy Bilateral RPG  C-ARM needed  **Patient has multiple allergies to medications**  **HX of A-fib & HTN**  . Cystoscopy with hydrodistension and biopsy  07/24/2011    Procedure: CYSTOSCOPY/BIOPSY/HYDRODISTENSION;  Surgeon: Ailene Rud, MD;  Location: [facility];  Service: Urology;  Laterality: Right;  HOD Poss Biopsy of Rt Ureteral Lesion Meatal Dilation   . Breast cyst excision Left AGE 3  . Joint replacement Bilateral 2008    right knee revision x 3.   . Cholecystectomy N/A 04/01/2015    Procedure: LAPAROSCOPIC CHOLECYSTECTOMY WITH INTRAOPERATIVE CHOLANGIOGRAM;  Surgeon:  Marlyce Huge, MD;  Location: [facility];  Service: General;  Laterality: N/A;    Prior to Admission medications   Medication Sig Start Date End Date Taking? Authorizing Provider  acetaminophen (TYLENOL) 325 MG tablet Take 650 mg by mouth every 6 (six) hours as needed for headache.    Yes Historical Provider, MD  ALPRAZolam (XANAX) 0.25 MG tablet Take 0.25 mg by mouth at bedtime.    Yes Historical Provider, MD  amLODipine (NORVASC) 10 MG tablet Take 10 mg by mouth daily.    Yes Historical Provider, MD  benazepril (LOTENSIN) 40 MG tablet TAKE 1 TABLET TWICE DAILY 04/15/15  Yes Historical Provider, MD  bisacodyl (DULCOLAX) 5 MG EC tablet Take 5 mg by mouth 2 (two) times daily.  03/23/12  Yes Historical Provider, MD  diphenhydrAMINE (BENADRYL) 25 mg capsule Take 25 mg by mouth every 6 (six) hours as needed for allergies.  03/23/12  Yes Historical Provider, MD  famotidine (PEPCID) 40 MG tablet Take 40 mg by mouth 2 (two) times daily as needed for heartburn or indigestion (acid reflux).    Yes Historical Provider, MD  FLUoxetine (PROZAC) 40 MG capsule Take 40 mg by mouth daily.    Yes Historical Provider, MD  OxyCODONE (OXYCONTIN) 40 mg T12A 12 hr tablet Take 40 mg by mouth 2 (two) times daily. Takes twice a day, then a 30 mg once a day when need litte.   Yes Historical Provider, MD  Oxycodone HCl 10 MG TABS Take 10 mg by mouth 3 (three) times daily as needed (breakthrough pain).  02/15/15  Yes Historical Provider, MD  OxyCODONE HCl ER (OXYCONTIN) 30 MG T12A Take 30 mg by mouth daily. Takes in addition to oxycontin 40 mg twice a day   Yes Historical Provider, MD  POLYETHYLENE GLYCOL 3350 PO Take 17 g by mouth daily as needed (for constipation.).   Yes Historical Provider, MD  predniSONE (DELTASONE) 20 MG tablet Prednisone 20 mg; 3 a day for 3 days then 2 a day for 3 days then 1 a day for 3 days 05/23/15  Yes Historical Provider, MD  cefUROXime (CEFTIN) 250 MG tablet Take 1 tablet (250 mg total) by  mouth 2 (two) times daily with a meal. Patient not taking: Reported on 05/28/2015 05/20/15   Bettey Costa, MD    Family History  Problem Relation Age of Onset  . Hypertension Mother   . Stroke Mother   . Cancer Father      Social History  Substance Use Topics  . Smoking status: Former Smoker -- 1.50 packs/day  Types: Cigarettes    Quit date: 07/22/1991  . Smokeless tobacco: Never Used  . Alcohol Use: Yes     Comment: RARE    Allergies as of 05/28/2015 - Review Complete 05/28/2015  Allergen Reaction Noted  . Cobalamine combinations Swelling 03/15/2015  . Iodinated diagnostic agents Hives 03/15/2015  . Ioxaglate Hives 05/15/2015  . Amitriptyline Palpitations 03/15/2015  . Butalbital-aspirin-caffeine Nausea Only 03/15/2015  . Furacin [nitrofurazone] Rash 07/22/2011  . Penicillins Rash 07/22/2011  . Sulfa antibiotics Swelling and Rash 07/22/2011    Review of Systems:    All systems reviewed and negative except where noted in HPI.   Physical Exam:  BP 153/72 mmHg  Pulse 83  Temp(Src) 98.1 F (36.7 C) (Oral)  Wt 182 lb (82.555 kg) No LMP recorded. Patient has had a hysterectomy. Psych:  Alert and cooperative. Normal mood and affect. General:   Alert,  Well-developed, well-nourished, pleasant and cooperative in NAD Head:  Normocephalic and atraumatic. Eyes:  Sclera clear, no icterus.   Conjunctiva pink. Ears:  Normal auditory acuity. Nose:  No deformity, discharge, or lesions. Mouth:  No deformity or lesions,oropharynx pink & moist. Neck:  Supple; no masses or thyromegaly. Lungs:  Respirations even and unlabored.  Clear throughout to auscultation.   No wheezes, crackles, or rhonchi. No acute distress. Heart:  Regular rate and rhythm; no murmurs, clicks, rubs, or gallops. Abdomen:  Normal bowel sounds.  No bruits.  Soft, non-tender and non-distended without masses, hepatosplenomegaly or hernias noted.  No guarding or rebound tenderness.  Negative Carnett sign.   Rectal:   Deferred.  Msk:  Symmetrical without gross deformities.  Good, equal movement & strength bilaterally. Pulses:  Normal pulses noted. Extremities:  No clubbing or edema.  No cyanosis. Neurologic:  Alert and oriented x3;  grossly normal neurologically. Skin:  Intact without significant lesions or rashes.  No jaundice. Lymph Nodes:  No significant cervical adenopathy. Psych:  Alert and cooperative. Normal mood and affect.  Imaging Studies: Ct Abdomen Pelvis W Contrast  05/14/2015  CLINICAL DATA:  Dilated common bile duct on cholangiogram EXAM: CT ABDOMEN AND PELVIS WITH CONTRAST TECHNIQUE: Multidetector CT imaging of the abdomen and pelvis was performed using the standard protocol following bolus administration of intravenous contrast. CONTRAST:  63mL OMNIPAQUE IOHEXOL 300 MG/ML  SOLN COMPARISON:  Intraoperative cholangiogram of 04/01/2015, ultrasound abdomen of 02/28/2015, and CT abdomen pelvis of 07/01/2011 FINDINGS: The lung bases are clear. Coronary artery calcifications are noted primarily in the distribution of the left anterior descending artery. The liver appears somewhat low in attenuation possibly due to fatty infiltration. No focal hepatic parenchymal lesion is seen. However, there is dilatation of the central intrahepatic biliary ducts as well as the common bile duct. Surgical clips are present from prior cholecystectomy. The common bile duct measures 9 mm in diameter on the coronal images. There may a soft tissue defect within the distal common bile duct. No pancreatic head mass and no encircling mass is evident. The considerations would be that of non calcified calculus, versus possible ampullary lesion. As on axial image 38, there is suspicion of prominence at the ampulla and ampullary lesion is a definite consideration. ERCP is recommended to evaluate further. The pancreatic duct is not dilated and no specific pancreatic lesion is seen. The adrenal glands and spleen are unremarkable. The  stomach is moderately distended with air and contrast with no abnormality evident. The kidneys enhance with no calculus or mass. No hydronephrosis is seen. On delayed images, there are small parapelvic  cysts on the left and is cyst is noted in the anterior mid left kidney of approximately 3.0 cm in diameter. The proximal ureters are normal in caliber. The abdominal aorta is normal in caliber with moderate atherosclerotic change for age. The distal ureters are normal in caliber and no distal ureteral calculus is seen. The urinary bladder is unremarkable. The uterus has previously been resected. No adnexal lesion is seen. No fluid is noted within the pelvis. The terminal ileum is unremarkable. The appendix is not definitely seen. There is mild anterolisthesis of L4 on L5 of 6 mm which appears to be due to degenerative change involving the facet joints. Degenerative disc disease is present most marked at L5-S1. IMPRESSION: 1. Dilated common bile duct to the ampulla where there is suspicion of a soft tissue mass. Recommend endoscopy to assess for an ampullary lesion or noncalcified calculus. Dilated intrahepatic ducts are noted as well. No pancreatic head lesion is seen. 2. Probable fatty infiltration of the liver. 3. Coronary artery calcifications. 4. 6 mm anterolisthesis of L4 on L5 due to degenerative change of the facet joints. Electronically Signed   By: Ivar Drape M.D.   On: 05/14/2015 14:41   US Renal  05/19/2015  CLINICAL DATA:  Acute renal failure. EXAM: RENAL / URINARY TRACT ULTRASOUND COMPLETE COMPARISON:  02/28/2015 FINDINGS: Right Kidney: Length: 11.6 cm. Echogenicity within normal limits. No mass or hydronephrosis visualized. Left Kidney: Length: 12.1 cm. Echogenicity within normal limits. No mass or hydronephrosis visualized. 3 cm left lower pole renal cyst again noted. Bladder: Appears normal for degree of bladder distention. IMPRESSION: Normal size kidneys.  No evidence of hydronephrosis.  Electronically Signed   By: Earle Gell M.D.   On: 05/19/2015 13:29    Assessment and Plan:   TIERNEY BEHL is a 81 y.o. y/o female  Who was found to have a dilated common bile duct with the suggested lesion at the ampulla. The patient was suggested to have investigation of this obstruction. The patient will be set up for a upper endoscopy with looking at the ampulla. I have discussed risks & benefits which include, but are not limited to, bleeding, infection, perforation & drug reaction.  The patient agrees with this plan & written consent will be obtained.      Note: This dictation was prepared with Dragon dictation along with smaller phrase technology. Any transcriptional errors that result from this process are unintentional.

## 2015-05-30 ENCOUNTER — Other Ambulatory Visit: Payer: Self-pay

## 2015-07-02 ENCOUNTER — Ambulatory Visit: Payer: Medicare Other | Admitting: Anesthesiology

## 2015-07-02 ENCOUNTER — Encounter
Admission: RE | Disposition: A | Discharge status: Home / Self Care | Payer: Self-pay | Source: Ambulatory Visit | Attending: Gastroenterology

## 2015-07-02 ENCOUNTER — Ambulatory Visit
Admission: RE | Admit: 2015-07-02 | Discharge: 2015-07-02 | Disposition: A | Discharge status: Home / Self Care | Payer: Medicare Other | Source: Ambulatory Visit | Attending: Gastroenterology | Admitting: Gastroenterology

## 2015-07-02 DIAGNOSIS — E7800 Pure hypercholesterolemia, unspecified: Secondary | ICD-10-CM | POA: Diagnosis not present

## 2015-07-02 DIAGNOSIS — M797 Fibromyalgia: Secondary | ICD-10-CM | POA: Diagnosis not present

## 2015-07-02 DIAGNOSIS — K219 Gastro-esophageal reflux disease without esophagitis: Secondary | ICD-10-CM | POA: Diagnosis not present

## 2015-07-02 DIAGNOSIS — N189 Chronic kidney disease, unspecified: Secondary | ICD-10-CM | POA: Diagnosis not present

## 2015-07-02 DIAGNOSIS — K449 Diaphragmatic hernia without obstruction or gangrene: Secondary | ICD-10-CM | POA: Insufficient documentation

## 2015-07-02 DIAGNOSIS — Z87891 Personal history of nicotine dependence: Secondary | ICD-10-CM | POA: Diagnosis not present

## 2015-07-02 DIAGNOSIS — J45909 Unspecified asthma, uncomplicated: Secondary | ICD-10-CM | POA: Diagnosis not present

## 2015-07-02 DIAGNOSIS — R933 Abnormal findings on diagnostic imaging of other parts of digestive tract: Secondary | ICD-10-CM | POA: Diagnosis not present

## 2015-07-02 DIAGNOSIS — M549 Dorsalgia, unspecified: Secondary | ICD-10-CM | POA: Insufficient documentation

## 2015-07-02 DIAGNOSIS — Z7982 Long term (current) use of aspirin: Secondary | ICD-10-CM | POA: Diagnosis not present

## 2015-07-02 DIAGNOSIS — K2950 Unspecified chronic gastritis without bleeding: Secondary | ICD-10-CM | POA: Diagnosis not present

## 2015-07-02 DIAGNOSIS — Z85820 Personal history of malignant melanoma of skin: Secondary | ICD-10-CM | POA: Insufficient documentation

## 2015-07-02 DIAGNOSIS — Z96653 Presence of artificial knee joint, bilateral: Secondary | ICD-10-CM | POA: Insufficient documentation

## 2015-07-02 DIAGNOSIS — G8929 Other chronic pain: Secondary | ICD-10-CM | POA: Insufficient documentation

## 2015-07-02 DIAGNOSIS — G709 Myoneural disorder, unspecified: Secondary | ICD-10-CM | POA: Insufficient documentation

## 2015-07-02 DIAGNOSIS — K920 Hematemesis: Secondary | ICD-10-CM | POA: Diagnosis not present

## 2015-07-02 DIAGNOSIS — H9191 Unspecified hearing loss, right ear: Secondary | ICD-10-CM | POA: Diagnosis not present

## 2015-07-02 DIAGNOSIS — I129 Hypertensive chronic kidney disease with stage 1 through stage 4 chronic kidney disease, or unspecified chronic kidney disease: Secondary | ICD-10-CM | POA: Insufficient documentation

## 2015-07-02 DIAGNOSIS — R110 Nausea: Secondary | ICD-10-CM | POA: Diagnosis not present

## 2015-07-02 DIAGNOSIS — Z79899 Other long term (current) drug therapy: Secondary | ICD-10-CM | POA: Insufficient documentation

## 2015-07-02 DIAGNOSIS — F329 Major depressive disorder, single episode, unspecified: Secondary | ICD-10-CM | POA: Diagnosis not present

## 2015-07-02 DIAGNOSIS — G4730 Sleep apnea, unspecified: Secondary | ICD-10-CM | POA: Insufficient documentation

## 2015-07-02 DIAGNOSIS — F419 Anxiety disorder, unspecified: Secondary | ICD-10-CM | POA: Insufficient documentation

## 2015-07-02 DIAGNOSIS — K2970 Gastritis, unspecified, without bleeding: Secondary | ICD-10-CM | POA: Insufficient documentation

## 2015-07-02 DIAGNOSIS — Z8541 Personal history of malignant neoplasm of cervix uteri: Secondary | ICD-10-CM | POA: Diagnosis not present

## 2015-07-02 DIAGNOSIS — R198 Other specified symptoms and signs involving the digestive system and abdomen: Secondary | ICD-10-CM | POA: Insufficient documentation

## 2015-07-02 HISTORY — DX: Headache, unspecified: R51.9

## 2015-07-02 HISTORY — DX: Unspecified asthma, uncomplicated: J45.909

## 2015-07-02 HISTORY — DX: Headache: R51

## 2015-07-02 HISTORY — PX: ESOPHAGOGASTRODUODENOSCOPY (EGD) WITH PROPOFOL: SHX5813

## 2015-07-02 SURGERY — ESOPHAGOGASTRODUODENOSCOPY (EGD) WITH PROPOFOL
Anesthesia: General

## 2015-07-02 MED ORDER — SODIUM CHLORIDE 0.9 % IV SOLN
INTRAVENOUS | Status: DC
Start: 2015-07-02 — End: 2015-07-02
  Administered 2015-07-02: 1000 mL via INTRAVENOUS

## 2015-07-02 MED ORDER — PROPOFOL 500 MG/50ML IV EMUL
INTRAVENOUS | Status: DC | PRN
  Administered 2015-07-02: 150 ug/kg/min via INTRAVENOUS

## 2015-07-02 MED ORDER — LIDOCAINE HCL (CARDIAC) 20 MG/ML IV SOLN
INTRAVENOUS | Status: DC | PRN
  Administered 2015-07-02: 60 mg via INTRAVENOUS

## 2015-07-02 NOTE — Anesthesia Preprocedure Evaluation (Addendum)
Anesthesia Evaluation  Patient identified by MRN, date of birth, ID band Patient awake    Reviewed: Allergy & Precautions, H&P , NPO status , Patient's Chart, lab work & pertinent test results, reviewed documented beta blocker date and time   Airway Mallampati: II  TM Distance: >3 FB Neck ROM: full    Dental no notable dental hx. (+) Teeth Intact   Pulmonary neg pulmonary ROS, shortness of breath and with exertion, asthma , sleep apnea , former smoker,    Pulmonary exam normal breath sounds clear to auscultation       Cardiovascular Exercise Tolerance: Good hypertension, negative cardio ROS   Rhythm:regular Rate:Normal     Neuro/Psych  Headaches,  Neuromuscular disease negative neurological ROS  negative psych ROS   GI/Hepatic negative GI ROS, Neg liver ROS, GERD  ,  Endo/Other  negative endocrine ROS  Renal/GU Renal diseasenegative Renal ROS  negative genitourinary   Musculoskeletal   Abdominal   Peds  Hematology negative hematology ROS (+) anemia ,   Anesthesia Other Findings   Reproductive/Obstetrics negative OB ROS                            Anesthesia Physical Anesthesia Plan  ASA: IV  Anesthesia Plan: General   Post-op Pain Management:    Induction:   Airway Management Planned:   Additional Equipment:   Intra-op Plan:   Post-operative Plan:   Informed Consent: I have reviewed the patients History and Physical, chart, labs and discussed the procedure including the risks, benefits and alternatives for the proposed anesthesia with the patient or authorized representative who has indicated his/her understanding and acceptance.   Dental Advisory Given  Plan Discussed with: CRNA  Anesthesia Plan Comments:        Anesthesia Quick Evaluation

## 2015-07-02 NOTE — H&P (Signed)
[facility]  [address], [place], [place] [address] Phone: [phone] Fax : [phone]  Primary Care Physician:  Kirk Ruths., MD Primary Gastroenterologist:  Dr. Allen Norris  Pre-Procedure History & Physical: HPI:  Kathleen Bates is a 81 y.o. female is here for an endoscopy.   Past Medical History  Diagnosis Date  . Hypertension   . 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 Status post excision of acoustic neuroma 1988    RESIDUAL--  RIGHT HEARING LOSS, MILD IMPAIRED BALANCE, AND DRY MOUTH  . Balance problem MILD-- POST ACOUSTIC NEUROMA  . Dry mouth     POST ACOUSTIC NEUROMA  . Interstitial cystitis   . Chronic cystitis   . Incontinence of urine   . Frequency of urination   . Urgency of urination   . Nocturia   . Chronic back pain     CHRONIC NARCTIC -- GOES TO PAIN CLINIC  . SOB (shortness of breath) on exertion   . Constipated   . Fibromyalgia   . Lumbar stenosis   . Cataract immature BILATERAL  . Macular degeneration of both eyes   . Insomnia   . Anxiety   . Depression   . History of cervical cancer S/P VAG. HYSTECTOMY AGE 89  . Hypercholesteremia   . IBS (irritable bowel syndrome)   . Hemorrhoids   . Anemia   . GERD (gastroesophageal reflux disease)   . Ringing in ear     right  . Chronic kidney disease     polynephritis/freq. uti  . Cancer ([place])   . Asthma   . Headache     Past Surgical History  Procedure Laterality Date  . Total knee arthroplasty  2006    LEFT  . Replacement total knee  1999    RIGHT  . Revision total knee arthroplasty  X2    RIGHT  . Cervical conization w/bx  AGE 63    POST REPAIR OF BLEEDING  . Dilation and curettage of uterus  AGE 63  . Vaginal hysterectomy  AGE 63  . Appendectomy  AGE  57  . Cystoscopy with urethral dilatation  AGE 35  . Tubal ligation  AGE 81  . Tonsillectomy and adenoidectomy  AGE 83  . Lipoma resection  1972    EXTENSIVE RIGHT HIP/THIGH  . Anterior and  posterior repair  1973  . Neuroplasty / transposition median nerve at carpal tunnel bilateral  1980  . Craniectomy for excision of acoustic neuroma  1988  . Melanoma excision  1992    LEFT SHOULDER  . Cysto/ [place]  . Pubovaginal sling  1998  . Lumbar spine surgery  [place]    no metal  . Cystoscopy/retrograde/ureteroscopy  07/24/2011    Procedure: CYSTOSCOPY/RETROGRADE/URETEROSCOPY;  Surgeon: Ailene Rud, MD;  Location: [facility];  Service: Urology;  Laterality: Right;  Cystoscopy Right Ureteroscopy Bilateral RPG  C-ARM needed  **Patient has multiple allergies to medications**  **HX of A-fib & HTN**  . Cystoscopy with hydrodistension and biopsy  07/24/2011    Procedure: CYSTOSCOPY/BIOPSY/HYDRODISTENSION;  Surgeon: Ailene Rud, MD;  Location: [facility];  Service: Urology;  Laterality: Right;  HOD Poss Biopsy of Rt Ureteral Lesion Meatal Dilation   . Breast cyst excision Left AGE 31  . Joint replacement Bilateral 2008    right knee revision x 3.   . Cholecystectomy N/A 04/01/2015    Procedure:  LAPAROSCOPIC CHOLECYSTECTOMY WITH INTRAOPERATIVE CHOLANGIOGRAM;  Surgeon: Marlyce Huge, MD;  Location: [facility];  Service: General;  Laterality: N/A;  . Brain surgery      Prior to Admission medications   Medication Sig Start Date End Date Taking? Authorizing Provider  acetaminophen (TYLENOL) 325 MG tablet Take 650 mg by mouth every 6 (six) hours as needed for headache.     Historical Provider, MD  ALPRAZolam Duanne Moron) 0.25 MG tablet Take 0.25 mg by mouth at bedtime.     Historical Provider, MD  amLODipine (NORVASC) 10 MG tablet Take 10 mg by mouth daily.     Historical Provider, MD  benazepril (LOTENSIN) 40 MG tablet TAKE 1 TABLET TWICE DAILY 04/15/15   Historical Provider, MD  bisacodyl (DULCOLAX) 5 MG EC tablet Take 5 mg by mouth 2 (two) times daily.  03/23/12   Historical Provider, MD  cefUROXime (CEFTIN) 250 MG  tablet Take 1 tablet (250 mg total) by mouth 2 (two) times daily with a meal. Patient not taking: Reported on 05/28/2015 05/20/15   Bettey Costa, MD  diphenhydrAMINE (BENADRYL) 25 mg capsule Take 25 mg by mouth every 6 (six) hours as needed for allergies.  03/23/12   Historical Provider, MD  famotidine (PEPCID) 40 MG tablet Take 40 mg by mouth 2 (two) times daily as needed for heartburn or indigestion (acid reflux).     Historical Provider, MD  FLUoxetine (PROZAC) 40 MG capsule Take 40 mg by mouth daily.     Historical Provider, MD  OxyCODONE (OXYCONTIN) 40 mg T12A 12 hr tablet Take 40 mg by mouth 2 (two) times daily. Takes twice a day, then a 30 mg once a day when need litte.    Historical Provider, MD  Oxycodone HCl 10 MG TABS Take 10 mg by mouth 3 (three) times daily as needed (breakthrough pain).  02/15/15   Historical Provider, MD  OxyCODONE HCl ER (OXYCONTIN) 30 MG T12A Take 30 mg by mouth daily. Takes in addition to oxycontin 40 mg twice a day    Historical Provider, MD  POLYETHYLENE GLYCOL 3350 PO Take 17 g by mouth daily as needed (for constipation.).    Historical Provider, MD  predniSONE (DELTASONE) 20 MG tablet Prednisone 20 mg; 3 a day for 3 days then 2 a day for 3 days then 1 a day for 3 days 05/23/15   Historical Provider, MD    Allergies as of 05/30/2015 - Review Complete 05/28/2015  Allergen Reaction Noted  . Cobalamine combinations Swelling 03/15/2015  . Iodinated diagnostic agents Hives 03/15/2015  . Ioxaglate Hives 05/15/2015  . Amitriptyline Palpitations 03/15/2015  . Butalbital-aspirin-caffeine Nausea Only 03/15/2015  . Furacin [nitrofurazone] Rash 07/22/2011  . Penicillins Rash 07/22/2011  . Sulfa antibiotics Swelling and Rash 07/22/2011    Family History  Problem Relation Age of Onset  . Hypertension Mother   . Stroke Mother   . Cancer Father     Social History   Social History  . Marital Status: Married    Spouse Name: N/A  . Number of Children: N/A  . Years  of Education: N/A   Occupational History  . Not on file.   Social History Main Topics  . Smoking status: Former Smoker -- 1.50 packs/day    Types: Cigarettes    Quit date: 07/22/1991  . Smokeless tobacco: Never Used  . Alcohol Use: Yes     Comment: RARE  . Drug Use: No  . Sexual Activity: Not on file   Other Topics Concern  .  Not on file   Social History Narrative    Review of Systems: See HPI, otherwise negative ROS  Physical Exam: BP 122/66 mmHg  Pulse 88  Temp(Src) 96.9 F (36.1 C) (Tympanic)  Resp 17  Ht 5\' 8"  (1.727 m)  Wt 182 lb (82.555 kg)  BMI 27.68 kg/m2  SpO2 99% General:   Alert,  pleasant and cooperative in NAD Head:  Normocephalic and atraumatic. Neck:  Supple; no masses or thyromegaly. Lungs:  Clear throughout to auscultation.    Heart:  Regular rate and rhythm. Abdomen:  Soft, nontender and nondistended. Normal bowel sounds, without guarding, and without rebound.   Neurologic:  Alert and  oriented x4;  grossly normal neurologically.  Impression/Plan: Kathleen Bates is here for an endoscopy to be performed for abnormal Ct of CBD and ampulla.  Risks, benefits, limitations, and alternatives regarding  endoscopy have been reviewed with the patient.  Questions have been answered.  All parties agreeable.   Ollen Bowl, MD  07/02/2015, 8:52 AM

## 2015-07-02 NOTE — Anesthesia Postprocedure Evaluation (Signed)
Anesthesia Post Note  Patient: Kathleen Bates  Procedure(s) Performed: Procedure(s) (LRB): ESOPHAGOGASTRODUODENOSCOPY (EGD) WITH PROPOFOL (N/A)  Patient location during evaluation: PACU Anesthesia Type: General Level of consciousness: awake and alert Pain management: pain level controlled Vital Signs Assessment: post-procedure vital signs reviewed and stable Respiratory status: spontaneous breathing, nonlabored ventilation, respiratory function stable and patient connected to nasal cannula oxygen Cardiovascular status: blood pressure returned to baseline and stable Postop Assessment: no signs of nausea or vomiting Anesthetic complications: no    Last Vitals:  Filed Vitals:   07/02/15 0926 07/02/15 0936  BP: 109/61 121/70  Pulse: 80 83  Temp:    Resp: 15 12    Last Pain:  Filed Vitals:   07/02/15 0940  PainSc: 2                  Molli Barrows

## 2015-07-02 NOTE — Op Note (Signed)
[facility] Patient Name: Kathleen Bates Procedure Date: 07/02/2015 8:56 AM MRN: [id number] Account #: [id number] Date of Birth: [birth year] Admit Type: Outpatient Age: 81 Room: [facility] ENDO ROOM 4 Gender: Female Note Status: Finalized Procedure:         Upper GI endoscopy Indications:       Abnormal CT of the GI tract Providers:         Lucilla Lame, MD Referring MD:      Ocie Cornfield. Ouida Sills, MD (Referring MD) Medicines:         Propofol per Anesthesia Complications:     No immediate complications. Procedure:         Pre-Anesthesia Assessment:                    - Prior to the procedure, a History and Physical was                     performed, and patient medications and allergies were                     reviewed. The patient's tolerance of previous anesthesia                     was also reviewed. The risks and benefits of the procedure                     and the sedation options and risks were discussed with the                     patient. All questions were answered, and informed consent                     was obtained. Prior Anticoagulants: The patient has taken                     no previous anticoagulant or antiplatelet agents. ASA                     Grade Assessment: II - A patient with mild systemic                     disease. After reviewing the risks and benefits, the                     patient was deemed in satisfactory condition to undergo                     the procedure.                    After obtaining informed consent, the endoscope was passed                     under direct vision. Throughout the procedure, the                     patient's blood pressure, pulse, and oxygen saturations                     were monitored continuously. The Olympus GIF-160 endoscope                     (S#. [id number]) was introduced through the mouth, and  advanced to the second part of duodenum. The upper GI     endoscopy was accomplished without difficulty. The patient                     tolerated the procedure well. Findings:      The examined esophagus was normal.      Localized mild inflammation characterized by erythema was found in the       gastric antrum. Biopsies were taken with a cold forceps for histology.      The ampulla was normal. Biopsies were taken with a cold forceps for       histology. Impression:        - Normal esophagus.                    - Gastritis. Biopsied.                    - Normal ampulla. Biopsied. Recommendation:    - Await pathology results.                    - Return to GI office in 3 weeks. Procedure Code(s): --- Professional ---                    [phone], Esophagogastroduodenoscopy, flexible, transoral;                     with biopsy, single or multiple Diagnosis Code(s): --- Professional ---                    R93.3, Abnormal findings on diagnostic imaging of other                     parts of digestive tract                    K29.70, Gastritis, unspecified, without bleeding CPT copyright 2014 American [employer] Association. All rights reserved. The codes documented in this report are preliminary and upon coder review may  be revised to meet current compliance requirements. Lucilla Lame, MD 07/02/2015 9:02:42 AM This report has been signed electronically. Number of Addenda: 0 Note Initiated On: 07/02/2015 8:56 AM      [facility]

## 2015-07-02 NOTE — Transfer of Care (Signed)
Immediate Anesthesia Transfer of Care Note  Patient: Kathleen Bates  Procedure(s) Performed: Procedure(s): ESOPHAGOGASTRODUODENOSCOPY (EGD) WITH PROPOFOL (N/A)  Patient Location: PACU and Endoscopy Unit  Anesthesia Type:General  Level of Consciousness: sedated  Airway & Oxygen Therapy: Patient Spontanous Breathing and Patient connected to nasal cannula oxygen  Post-op Assessment: Report given to RN and Post -op Vital signs reviewed and stable  Post vital signs: Reviewed and stable  Last Vitals: 09:10 96.6temp 17resp 98% 78 hr 103/56  Filed Vitals:   07/02/15 0824 07/02/15 0906  BP: 122/66 103/56  Pulse: 88 78  Temp: 36.1 C 36.3 C  Resp: 17 16    Complications: No apparent anesthesia complications

## 2015-07-03 ENCOUNTER — Encounter: Payer: Self-pay | Admitting: Gastroenterology

## 2015-07-03 LAB — SURGICAL PATHOLOGY

## 2015-07-04 ENCOUNTER — Encounter: Payer: Self-pay | Admitting: Gastroenterology

## 2015-07-05 ENCOUNTER — Telehealth: Payer: Self-pay | Admitting: Gastroenterology

## 2015-07-05 NOTE — Telephone Encounter (Signed)
A result letter was mailed to this pt yesterday. No appt needed unless she is still having problems.

## 2015-07-05 NOTE — Telephone Encounter (Signed)
Patient called and said Dr. Allen Norris told her to make an appointment when the results come in. Let me know when you want me to do that please. I hate to make the appointment and nothing is back yet.

## 2015-07-11 ENCOUNTER — Telehealth: Payer: Self-pay | Admitting: Gastroenterology

## 2015-07-11 NOTE — Telephone Encounter (Signed)
Returned pt's call regarding her EGD results. Advised her the pathology came back only showing inflammation. Pt asked is this area of concern was sent off to check for cancer. i advised pt all tissue samples taken by Dr. Allen Norris are checked for cancer. This area of concern noted on the CT scan was check. Asked pt to schedule a follow up appt to discuss findings with Dr. Allen Norris. Pt stated if it only showed inflammation there was no need to schedule the appt. Advised her to schedule a follow up with her PCP if she didn't want to make one with Dr. Allen Norris if she felt there was still something wrong.

## 2015-07-11 NOTE — Telephone Encounter (Signed)
Patient would like to talk to you. Kathleen Bates feels something else is still going on and wants to know if Dr. Allen Norris said anything about cancer. I told her I could make an appointment for her. Please call her.

## 2015-09-20 ENCOUNTER — Encounter: Payer: Self-pay | Admitting: Emergency Medicine

## 2015-09-20 ENCOUNTER — Emergency Department
Admission: EM | Admit: 2015-09-20 | Discharge: 2015-09-20 | Disposition: A | Discharge status: Home / Self Care | Payer: Medicare Other | Attending: Emergency Medicine | Admitting: Emergency Medicine

## 2015-09-20 ENCOUNTER — Emergency Department: Payer: Medicare Other

## 2015-09-20 DIAGNOSIS — Z79899 Other long term (current) drug therapy: Secondary | ICD-10-CM | POA: Diagnosis not present

## 2015-09-20 DIAGNOSIS — Y998 Other external cause status: Secondary | ICD-10-CM | POA: Diagnosis not present

## 2015-09-20 DIAGNOSIS — Z87891 Personal history of nicotine dependence: Secondary | ICD-10-CM | POA: Insufficient documentation

## 2015-09-20 DIAGNOSIS — Z7952 Long term (current) use of systemic steroids: Secondary | ICD-10-CM | POA: Diagnosis not present

## 2015-09-20 DIAGNOSIS — I129 Hypertensive chronic kidney disease with stage 1 through stage 4 chronic kidney disease, or unspecified chronic kidney disease: Secondary | ICD-10-CM | POA: Diagnosis not present

## 2015-09-20 DIAGNOSIS — S0990XA Unspecified injury of head, initial encounter: Secondary | ICD-10-CM | POA: Diagnosis not present

## 2015-09-20 DIAGNOSIS — W01198A Fall on same level from slipping, tripping and stumbling with subsequent striking against other object, initial encounter: Secondary | ICD-10-CM | POA: Insufficient documentation

## 2015-09-20 DIAGNOSIS — Z880 Allergy status to penicillin: Secondary | ICD-10-CM | POA: Insufficient documentation

## 2015-09-20 DIAGNOSIS — Y9289 Other specified places as the place of occurrence of the external cause: Secondary | ICD-10-CM | POA: Diagnosis not present

## 2015-09-20 DIAGNOSIS — N189 Chronic kidney disease, unspecified: Secondary | ICD-10-CM | POA: Diagnosis not present

## 2015-09-20 DIAGNOSIS — S299XXA Unspecified injury of thorax, initial encounter: Secondary | ICD-10-CM | POA: Diagnosis present

## 2015-09-20 DIAGNOSIS — S20229A Contusion of unspecified back wall of thorax, initial encounter: Secondary | ICD-10-CM | POA: Insufficient documentation

## 2015-09-20 DIAGNOSIS — S20419A Abrasion of unspecified back wall of thorax, initial encounter: Secondary | ICD-10-CM | POA: Insufficient documentation

## 2015-09-20 DIAGNOSIS — Y9389 Activity, other specified: Secondary | ICD-10-CM | POA: Diagnosis not present

## 2015-09-20 NOTE — ED Provider Notes (Signed)
Time Seen: Approximately 0 900 I have reviewed the triage notes  Chief Complaint: Fall and Head Injury   History of Present Illness: Kathleen Bates is a 82 y.o. female who has significant history of surgery for an acute stick neuroma. The patient states that she was ambulating early this morning and turned quickly and lost her balance. She fell landing mostly backward toward her right side and hit her head on the birth of a fireplace. Patient denies any wrist pain to this historian but mainly over the upper part of her back is where most of her discomfort is. She denies any chest pain, loss of consciousness, shortness of breath, she has been ambulating since her fall. Past Medical History  Diagnosis Date  . Hypertension   . 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 Status post excision of acoustic neuroma 1988    RESIDUAL--  RIGHT HEARING LOSS, MILD IMPAIRED BALANCE, AND DRY MOUTH  . Balance problem MILD-- POST ACOUSTIC NEUROMA  . Dry mouth     POST ACOUSTIC NEUROMA  . Interstitial cystitis   . Chronic cystitis   . Incontinence of urine   . Frequency of urination   . Urgency of urination   . Nocturia   . Chronic back pain     CHRONIC NARCTIC -- GOES TO PAIN CLINIC  . SOB (shortness of breath) on exertion   . Constipated   . Fibromyalgia   . Lumbar stenosis   . Cataract immature BILATERAL  . Macular degeneration of both eyes   . Insomnia   . Anxiety   . Depression   . History of cervical cancer S/P VAG. HYSTECTOMY AGE 20  . Hypercholesteremia   . IBS (irritable bowel syndrome)   . Hemorrhoids   . Anemia   . GERD (gastroesophageal reflux disease)   . Ringing in ear     right  . Chronic kidney disease     polynephritis/freq. uti  . Cancer ([place])   . Asthma   . Headache     Patient Active Problem List   Diagnosis Date Noted  . Abnormal findings-gastrointestinal tract   . Gastritis   . Hernia, hiatal   . Hematemesis with nausea   . ARF (acute renal  failure) ([place]) 05/19/2015  . Anuria and oliguria 05/19/2015  . Chronic LBP 05/10/2015  . Chronic pain associated with significant psychosocial dysfunction 05/10/2015  . Encounter for other administrative examinations 05/10/2015  . Chronic interstitial cystitis 04/19/2015  . Cholelithiasis   . Hemorrhoid 10/09/2014  . H/O Malignant melanoma 10/09/2014  . Spinal stenosis 10/09/2014  . History of repair of rotator cuff 06/25/2014  . Complete rotator cuff rupture of left shoulder 06/15/2014  . DDD (degenerative disc disease), cervical 05/09/2014  . Airway hyperreactivity 04/03/2014  . Benign hypertension 04/03/2014  . Acid reflux 04/03/2014  . Hypercholesteremia 04/03/2014  . Fibrositis 05/24/2013  . Fibromyalgia 05/24/2013  . Apnea, sleep 01/31/2003    Past Surgical History  Procedure Laterality Date  . Total knee arthroplasty  2006    LEFT  . Replacement total knee  1999    RIGHT  . Revision total knee arthroplasty  X2    RIGHT  . Cervical conization w/bx  AGE 64    POST REPAIR OF BLEEDING  . Dilation and curettage of uterus  AGE 64  . Vaginal hysterectomy  AGE 64  . Appendectomy  AGE  89  . Cystoscopy with urethral dilatation  AGE 47  .  Tubal ligation  AGE 89  . Tonsillectomy and adenoidectomy  AGE 75  . Lipoma resection  1972    EXTENSIVE RIGHT HIP/THIGH  . Anterior and posterior repair  1973  . Neuroplasty / transposition median nerve at carpal tunnel bilateral  1980  . Craniectomy for excision of acoustic neuroma  1988  . Melanoma excision  1992    LEFT SHOULDER  . Cysto/ [place]  . Pubovaginal sling  1998  . Lumbar spine surgery  [place]    no metal  . Cystoscopy/retrograde/ureteroscopy  07/24/2011    Procedure: CYSTOSCOPY/RETROGRADE/URETEROSCOPY;  Surgeon: Ailene Rud, MD;  Location: [facility];  Service: Urology;  Laterality: Right;  Cystoscopy Right Ureteroscopy Bilateral RPG  C-ARM needed  **Patient has multiple  allergies to medications**  **HX of A-fib & HTN**  . Cystoscopy with hydrodistension and biopsy  07/24/2011    Procedure: CYSTOSCOPY/BIOPSY/HYDRODISTENSION;  Surgeon: Ailene Rud, MD;  Location: [facility];  Service: Urology;  Laterality: Right;  HOD Poss Biopsy of Rt Ureteral Lesion Meatal Dilation   . Breast cyst excision Left AGE 89  . Joint replacement Bilateral 2008    right knee revision x 3.   . Cholecystectomy N/A 04/01/2015    Procedure: LAPAROSCOPIC CHOLECYSTECTOMY WITH INTRAOPERATIVE CHOLANGIOGRAM;  Surgeon: Marlyce Huge, MD;  Location: [facility];  Service: General;  Laterality: N/A;  . Brain surgery    . Esophagogastroduodenoscopy (egd) with propofol N/A 07/02/2015    Procedure: ESOPHAGOGASTRODUODENOSCOPY (EGD) WITH PROPOFOL;  Surgeon: Lucilla Lame, MD;  Location: [facility];  Service: Endoscopy;  Laterality: N/A;    Past Surgical History  Procedure Laterality Date  . Total knee arthroplasty  2006    LEFT  . Replacement total knee  1999    RIGHT  . Revision total knee arthroplasty  X2    RIGHT  . Cervical conization w/bx  AGE 30    POST REPAIR OF BLEEDING  . Dilation and curettage of uterus  AGE 30  . Vaginal hysterectomy  AGE 30  . Appendectomy  AGE  50  . Cystoscopy with urethral dilatation  AGE 67  . Tubal ligation  AGE 89  . Tonsillectomy and adenoidectomy  AGE 75  . Lipoma resection  1972    EXTENSIVE RIGHT HIP/THIGH  . Anterior and posterior repair  1973  . Neuroplasty / transposition median nerve at carpal tunnel bilateral  1980  . Craniectomy for excision of acoustic neuroma  1988  . Melanoma excision  1992    LEFT SHOULDER  . Cysto/ [place]  . Pubovaginal sling  1998  . Lumbar spine surgery  [place]    no metal  . Cystoscopy/retrograde/ureteroscopy  07/24/2011    Procedure: CYSTOSCOPY/RETROGRADE/URETEROSCOPY;  Surgeon: Ailene Rud, MD;  Location: [facility];  Service:  Urology;  Laterality: Right;  Cystoscopy Right Ureteroscopy Bilateral RPG  C-ARM needed  **Patient has multiple allergies to medications**  **HX of A-fib & HTN**  . Cystoscopy with hydrodistension and biopsy  07/24/2011    Procedure: CYSTOSCOPY/BIOPSY/HYDRODISTENSION;  Surgeon: Ailene Rud, MD;  Location: [facility];  Service: Urology;  Laterality: Right;  HOD Poss Biopsy of Rt Ureteral Lesion Meatal Dilation   . Breast cyst excision Left AGE 89  . Joint replacement Bilateral 2008    right knee revision x 3.   . Cholecystectomy N/A 04/01/2015    Procedure: LAPAROSCOPIC  CHOLECYSTECTOMY WITH INTRAOPERATIVE CHOLANGIOGRAM;  Surgeon: Marlyce Huge, MD;  Location: [facility];  Service: General;  Laterality: N/A;  . Brain surgery    . Esophagogastroduodenoscopy (egd) with propofol N/A 07/02/2015    Procedure: ESOPHAGOGASTRODUODENOSCOPY (EGD) WITH PROPOFOL;  Surgeon: Lucilla Lame, MD;  Location: [facility];  Service: Endoscopy;  Laterality: N/A;    Current Outpatient Rx  Name  Route  Sig  Dispense  Refill  . acetaminophen (TYLENOL) 325 MG tablet   Oral   Take 650 mg by mouth every 6 (six) hours as needed for headache.          . ALPRAZolam (XANAX) 0.25 MG tablet   Oral   Take 0.25 mg by mouth at bedtime.          Marland Kitchen amLODipine (NORVASC) 10 MG tablet   Oral   Take 10 mg by mouth daily.          . benazepril (LOTENSIN) 40 MG tablet      TAKE 1 TABLET TWICE DAILY         . bisacodyl (DULCOLAX) 5 MG EC tablet   Oral   Take 5 mg by mouth 2 (two) times daily.          . cefUROXime (CEFTIN) 250 MG tablet   Oral   Take 1 tablet (250 mg total) by mouth 2 (two) times daily with a meal. Patient not taking: Reported on 05/28/2015   12 tablet   0   . diphenhydrAMINE (BENADRYL) 25 mg capsule   Oral   Take 25 mg by mouth every 6 (six) hours as needed for allergies.          . famotidine (PEPCID) 40 MG tablet   Oral   Take 40 mg by mouth  2 (two) times daily as needed for heartburn or indigestion (acid reflux).          Marland Kitchen FLUoxetine (PROZAC) 40 MG capsule   Oral   Take 40 mg by mouth daily.          . OxyCODONE (OXYCONTIN) 40 mg T12A 12 hr tablet   Oral   Take 40 mg by mouth 2 (two) times daily. Takes twice a day, then a 30 mg once a day when need litte.         . Oxycodone HCl 10 MG TABS   Oral   Take 10 mg by mouth 3 (three) times daily as needed (breakthrough pain).          . OxyCODONE HCl ER (OXYCONTIN) 30 MG T12A   Oral   Take 30 mg by mouth daily. Takes in addition to oxycontin 40 mg twice a day         . POLYETHYLENE GLYCOL 3350 PO   Oral   Take 17 g by mouth daily as needed (for constipation.).         Marland Kitchen predniSONE (DELTASONE) 20 MG tablet      Prednisone 20 mg; 3 a day for 3 days then 2 a day for 3 days then 1 a day for 3 days           Allergies:  Cobalamine combinations; Iodinated diagnostic agents; Ioxaglate; Amitriptyline; Butalbital-aspirin-caffeine; Furacin; Penicillins; and Sulfa antibiotics  Family History: Family History  Problem Relation Age of Onset  . Hypertension Mother   . Stroke Mother   . Cancer Father     Social History: Social History  Substance Use Topics  . Smoking status: Former Smoker -- 1.50 packs/day  Types: Cigarettes    Quit date: 07/22/1991  . Smokeless tobacco: Never Used  . Alcohol Use: Yes     Comment: RARE     Review of Systems:   10 point review of systems was performed and was otherwise negative:  Constitutional: No fever Eyes: No visual disturbances ENT: No sore throat, ear pain Cardiac: No chest pain Respiratory: No shortness of breath, wheezing, or stridor Abdomen: No abdominal pain, no vomiting, No diarrhea Endocrine: No weight loss, No night sweats Extremities: No peripheral edema, cyanosis Skin: No rashes, easy bruising Neurologic: No focal weakness, trouble with speech or swollowing Urologic: No dysuria, Hematuria, or  urinary frequency   Physical Exam:  ED Triage Vitals  Enc Vitals Group     BP 09/20/15 0749 131/56 mmHg     Pulse Rate 09/20/15 0749 80     Resp 09/20/15 0749 18     Temp 09/20/15 0749 97.8 F (36.6 C)     Temp src --      SpO2 09/20/15 0749 95 %     Weight 09/20/15 0749 180 lb (81.647 kg)     Height 09/20/15 0749 5\' 8"  (1.727 m)     Head Cir --      Peak Flow --      Pain Score 09/20/15 0750 7     Pain Loc --      Pain Edu? --      Excl. in [place]? --     General: Awake , Alert , and Oriented times 3; GCS 15 Head: Normal cephalic , atraumatic slight tenderness over the right parietal area without any crepitus or step-off noted no active bleeding Eyes: Pupils equal , round, reactive to light Nose/Throat: No nasal drainage, patent upper airway without erythema or exudate.  Neck: Supple, Full range of motion, No anterior adenopathy or palpable thyroid masses patient has good flexion extension rotation without any midline pain or neuropraxia Lungs: Clear to ascultation without wheezes , rhonchi, or rales Heart: Regular rate, regular rhythm without murmurs , gallops , or rubs Abdomen: Soft, non tender without rebound, guarding , or rigidity; bowel sounds positive and symmetric in all 4 quadrants. No organomegaly .        Extremities: 2 plus symmetric pulses. No edema, clubbing or cyanosis Neurologic: normal ambulation, Motor symmetric without deficits, sensory intact Skin: warm, dry, no rashes Patient has a small abrasion and some swelling and point tenderness over the upper thoracic spine. There is no crepitus or step-off noted  Radiology:       EXAM: CT HEAD WITHOUT CONTRAST  TECHNIQUE: Contiguous axial images were obtained from the base of the skull through the vertex without intravenous contrast.  COMPARISON: Brain MRI - 04/13/2008  FINDINGS: There is geographic subcutaneous swelling about the right posterior parietal calvarium without associated radiopaque foreign  body or displaced calvarial fracture.  Stable sequela of prior right suboccipital craniotomy.  Similar findings of mild atrophy with sulcal prominence. The gray-white differentiation is maintained. No CT evidence of acute large territory infarct. Linear dural calcifications about the right ventral aspect of the pons are favored to be postoperative in etiology. No intraparenchymal or extra-axial mass or hemorrhage. Unchanged size and configuration of the ventricles and basilar cisterns. No midline shift. Limited visualization of the paranasal sinuses and mastoid air cells is normal. No air-fluid levels.  IMPRESSION: Subcutaneous swelling about the right posterior parietal calvarium without associated radiopaque foreign body, displaced calvarial fracture or acute intracranial process. EXAM: THORACIC SPINE 2 VIEWS  COMPARISON: 03/26/2015  FINDINGS: Anatomic alignment. No vertebral compression. Mild degenerative disc disease throughout the thoracic spine. Vascular calcifications are noted.  IMPRESSION: No acute bony pathology. Mild chronic changes.   Electronically Signed By: Marybelle Killings M.D.   I personally reviewed the radiologic studies    ED Course:  Patient's stay here was uneventful. She is on chronic pain medication and took one of her OxyContin that she had with her. She does not appear to have suffered any significant injury from her fall. She most likely has a mild concussion. The eyes continue all her current medications at home and return here if she has increased headache, persistent vomiting or any other new concerns. All questions concerns were addressed at the bedside. She has some generalized body aches after a fall which I felt was due to general trauma but she was advised to return here if she has any point tenderness.   Assessment: Acute closed head injury Thoracic spine contusion   Final Clinical Impression:   Final diagnoses:  Head injury,  initial encounter     Plan: * Outpatient management Patient was advised to return immediately if condition worsens. Patient was advised to follow up with their primary care physician or other specialized physicians involved in their outpatient care             Daymon Larsen, MD 09/20/15 1035

## 2015-09-20 NOTE — ED Notes (Signed)
Pt presents with pain to back of neck. Swelling and tenderness noted to base of back of neck. Area slightly reddened and small abrasion noted.

## 2015-09-20 NOTE — Discharge Instructions (Signed)
Concussion, Adult °A concussion, or closed-head injury, is a brain injury caused by a direct blow to the head or by a quick and sudden movement (jolt) of the head or neck. Concussions are usually not life-threatening. Even so, the effects of a concussion can be serious. If you have had a concussion before, you are more likely to experience concussion-like symptoms after a direct blow to the head.  °CAUSES °· Direct blow to the head, such as from running into another player during a soccer game, being hit in a fight, or hitting your head on a hard surface. °· A jolt of the head or neck that causes the brain to move back and forth inside the skull, such as in a car crash. °SIGNS AND SYMPTOMS °The signs of a concussion can be hard to notice. Early on, they may be missed by you, family members, and health care providers. You may look fine but act or feel differently. °Symptoms are usually temporary, but they may last for days, weeks, or even longer. Some symptoms may appear right away while others may not show up for hours or days. Every head injury is different. Symptoms include: °· Mild to moderate headaches that will not go away. °· A feeling of pressure inside your head. °· Having more trouble than usual: °¨ Learning or remembering things you have heard. °¨ Answering questions. °¨ Paying attention or concentrating. °¨ Organizing daily tasks. °¨ Making decisions and solving problems. °· Slowness in thinking, acting or reacting, speaking, or reading. °· Getting lost or being easily confused. °· Feeling tired all the time or lacking energy (fatigued). °· Feeling drowsy. °· Sleep disturbances. °¨ Sleeping more than usual. °¨ Sleeping less than usual. °¨ Trouble falling asleep. °¨ Trouble sleeping (insomnia). °· Loss of balance or feeling lightheaded or dizzy. °· Nausea or vomiting. °· Numbness or tingling. °· Increased sensitivity to: °¨ Sounds. °¨ Lights. °¨ Distractions. °· Vision problems or eyes that tire  easily. °· Diminished sense of taste or smell. °· Ringing in the ears. °· Mood changes such as feeling sad or anxious. °· Becoming easily irritated or angry for little or no reason. °· Lack of motivation. °· Seeing or hearing things other people do not see or hear (hallucinations). °DIAGNOSIS °Your health care provider can usually diagnose a concussion based on a description of your injury and symptoms. He or she will ask whether you passed out (lost consciousness) and whether you are having trouble remembering events that happened right before and during your injury. °Your evaluation might include: °· A brain scan to look for signs of injury to the brain. Even if the test shows no injury, you may still have a concussion. °· Blood tests to be sure other problems are not present. °TREATMENT °· Concussions are usually treated in an emergency department, in urgent care, or at a clinic. You may need to stay in the hospital overnight for further treatment. °· Tell your health care provider if you are taking any medicines, including prescription medicines, over-the-counter medicines, and natural remedies. Some medicines, such as blood thinners (anticoagulants) and aspirin, may increase the chance of complications. Also tell your health care provider whether you have had alcohol or are taking illegal drugs. This information may affect treatment. °· Your health care provider will send you home with important instructions to follow. °· How fast you will recover from a concussion depends on many factors. These factors include how severe your concussion is, what part of your brain was injured,   your age, and how healthy you were before the concussion. °· Most people with mild injuries recover fully. Recovery can take time. In general, recovery is slower in older persons. Also, persons who have had a concussion in the past or have other medical problems may find that it takes longer to recover from their current injury. °HOME  CARE INSTRUCTIONS °General Instructions °· Carefully follow the directions your health care provider gave you. °· Only take over-the-counter or prescription medicines for pain, discomfort, or fever as directed by your health care provider. °· Take only those medicines that your health care provider has approved. °· Do not drink alcohol until your health care provider says you are well enough to do so. Alcohol and certain other drugs may slow your recovery and can put you at risk of further injury. °· If it is harder than usual to remember things, write them down. °· If you are easily distracted, try to do one thing at a time. For example, do not try to watch TV while fixing dinner. °· Talk with family members or close friends when making important decisions. °· Keep all follow-up appointments. Repeated evaluation of your symptoms is recommended for your recovery. °· Watch your symptoms and tell others to do the same. Complications sometimes occur after a concussion. Older adults with a brain injury may have a higher risk of serious complications, such as a blood clot on the brain. °· Tell your teachers, school nurse, school counselor, coach, athletic trainer, or work manager about your injury, symptoms, and restrictions. Tell them about what you can or cannot do. They should watch for: °¨ Increased problems with attention or concentration. °¨ Increased difficulty remembering or learning new information. °¨ Increased time needed to complete tasks or assignments. °¨ Increased irritability or decreased ability to cope with stress. °¨ Increased symptoms. °· Rest. Rest helps the brain to heal. Make sure you: °¨ Get plenty of sleep at night. Avoid staying up late at night. °¨ Keep the same bedtime hours on weekends and weekdays. °¨ Rest during the day. Take daytime naps or rest breaks when you feel tired. °· Limit activities that require a lot of thought or concentration. These include: °¨ Doing homework or job-related  work. °¨ Watching TV. °¨ Working on the computer. °· Avoid any situation where there is potential for another head injury (football, hockey, soccer, basketball, martial arts, downhill snow sports and horseback riding). Your condition will get worse every time you experience a concussion. You should avoid these activities until you are evaluated by the appropriate follow-up health care providers. °Returning To Your Regular Activities °You will need to return to your normal activities slowly, not all at once. You must give your body and brain enough time for recovery. °· Do not return to sports or other athletic activities until your health care provider tells you it is safe to do so. °· Ask your health care provider when you can drive, ride a bicycle, or operate heavy machinery. Your ability to react may be slower after a brain injury. Never do these activities if you are dizzy. °· Ask your health care provider about when you can return to work or school. °Preventing Another Concussion °It is very important to avoid another brain injury, especially before you have recovered. In rare cases, another injury can lead to permanent brain damage, brain swelling, or death. The risk of this is greatest during the first 7-10 days after a head injury. Avoid injuries by: °· Wearing a   seat belt when riding in a car.  Drinking alcohol only in moderation.  Wearing a helmet when biking, skiing, skateboarding, skating, or doing similar activities.  Avoiding activities that could lead to a second concussion, such as contact or recreational sports, until your health care provider says it is okay.  Taking safety measures in your home.  Remove clutter and tripping hazards from floors and stairways.  Use grab bars in bathrooms and handrails by stairs.  Place non-slip mats on floors and in bathtubs.  Improve lighting in dim areas. SEEK MEDICAL CARE IF:  You have increased problems paying attention or  concentrating.  You have increased difficulty remembering or learning new information.  You need more time to complete tasks or assignments than before.  You have increased irritability or decreased ability to cope with stress.  You have more symptoms than before. Seek medical care if you have any of the following symptoms for more than 2 weeks after your injury:  Lasting (chronic) headaches.  Dizziness or balance problems.  Nausea.  Vision problems.  Increased sensitivity to noise or light.  Depression or mood swings.  Anxiety or irritability.  Memory problems.  Difficulty concentrating or paying attention.  Sleep problems.  Feeling tired all the time. SEEK IMMEDIATE MEDICAL CARE IF:  You have severe or worsening headaches. These may be a sign of a blood clot in the brain.  You have weakness (even if only in one hand, leg, or part of the face).  You have numbness.  You have decreased coordination.  You vomit repeatedly.  You have increased sleepiness.  One pupil is larger than the other.  You have convulsions.  You have slurred speech.  You have increased confusion. This may be a sign of a blood clot in the brain.  You have increased restlessness, agitation, or irritability.  You are unable to recognize people or places.  You have neck pain.  It is difficult to wake you up.  You have unusual behavior changes.  You lose consciousness. MAKE SURE YOU:  Understand these instructions.  Will watch your condition.  Will get help right away if you are not doing well or get worse.   This information is not intended to replace advice given to you by your health care provider. Make sure you discuss any questions you have with your health care provider.   Document Released: 10/10/2003 Document Revised: 08/10/2014 Document Reviewed: 02/09/2013 Elsevier Interactive Patient Education [employer].  Please return immediately if condition worsens.  Please contact her primary physician or the physician you were given for referral. If you have any specialist physicians involved in her treatment and plan please also contact them. Thank you for using [facility]. You can also take over-the-counter ibuprofen and/or Tylenol for pain. Ice for the first 24 hours of areas of discomfort and then moist warm heat. Return here especially for developing headache, weakness, or any other new concerns.

## 2015-09-20 NOTE — ED Notes (Signed)
Pt from home, states she lost her balance and fell and hit her head on her hearth.  Pt also fell on right wrist and c/o some discomfort. Pt denies LOC. Pt states she loses balance quick and got out of the chair quickly this morning when she had to use the bathroom. 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 Pt also c/o heartburn and indigestion. Uses a cane to assist with ambulating.

## 2015-11-05 ENCOUNTER — Other Ambulatory Visit: Payer: Self-pay | Admitting: Internal Medicine

## 2015-11-05 DIAGNOSIS — R1011 Right upper quadrant pain: Secondary | ICD-10-CM

## 2015-11-22 ENCOUNTER — Ambulatory Visit
Admission: RE | Admit: 2015-11-22 | Discharge: 2015-11-22 | Disposition: A | Discharge status: Home / Self Care | Payer: Medicare Other | Source: Ambulatory Visit | Attending: Internal Medicine | Admitting: Internal Medicine

## 2015-11-22 DIAGNOSIS — Z9049 Acquired absence of other specified parts of digestive tract: Secondary | ICD-10-CM | POA: Diagnosis not present

## 2015-11-22 DIAGNOSIS — K838 Other specified diseases of biliary tract: Secondary | ICD-10-CM | POA: Diagnosis not present

## 2015-11-22 DIAGNOSIS — R1011 Right upper quadrant pain: Secondary | ICD-10-CM | POA: Insufficient documentation

## 2015-11-22 DIAGNOSIS — N281 Cyst of kidney, acquired: Secondary | ICD-10-CM | POA: Insufficient documentation

## 2015-11-22 LAB — POCT I-STAT CREATININE: CREATININE: 0.7 mg/dL (ref 0.44–1.00)

## 2015-11-22 MED ORDER — GADOBENATE DIMEGLUMINE 529 MG/ML IV SOLN
20.0000 mL | Freq: Once | INTRAVENOUS | Status: AC | PRN
  Administered 2015-11-22: 17 mL via INTRAVENOUS

## 2015-11-26 ENCOUNTER — Emergency Department: Payer: Medicare Other

## 2015-11-26 ENCOUNTER — Encounter: Payer: Self-pay | Admitting: *Deleted

## 2015-11-26 ENCOUNTER — Emergency Department
Admission: EM | Admit: 2015-11-26 | Discharge: 2015-11-26 | Disposition: A | Discharge status: Home / Self Care | Payer: Medicare Other | Attending: Emergency Medicine | Admitting: Emergency Medicine

## 2015-11-26 DIAGNOSIS — M5030 Other cervical disc degeneration, unspecified cervical region: Secondary | ICD-10-CM | POA: Insufficient documentation

## 2015-11-26 DIAGNOSIS — J45909 Unspecified asthma, uncomplicated: Secondary | ICD-10-CM | POA: Insufficient documentation

## 2015-11-26 DIAGNOSIS — Z87891 Personal history of nicotine dependence: Secondary | ICD-10-CM | POA: Insufficient documentation

## 2015-11-26 DIAGNOSIS — F329 Major depressive disorder, single episode, unspecified: Secondary | ICD-10-CM | POA: Insufficient documentation

## 2015-11-26 DIAGNOSIS — Z79899 Other long term (current) drug therapy: Secondary | ICD-10-CM | POA: Diagnosis not present

## 2015-11-26 DIAGNOSIS — Z8541 Personal history of malignant neoplasm of cervix uteri: Secondary | ICD-10-CM | POA: Insufficient documentation

## 2015-11-26 DIAGNOSIS — I129 Hypertensive chronic kidney disease with stage 1 through stage 4 chronic kidney disease, or unspecified chronic kidney disease: Secondary | ICD-10-CM | POA: Diagnosis not present

## 2015-11-26 DIAGNOSIS — Y939 Activity, unspecified: Secondary | ICD-10-CM | POA: Insufficient documentation

## 2015-11-26 DIAGNOSIS — W19XXXA Unspecified fall, initial encounter: Secondary | ICD-10-CM | POA: Diagnosis not present

## 2015-11-26 DIAGNOSIS — S0990XA Unspecified injury of head, initial encounter: Secondary | ICD-10-CM | POA: Insufficient documentation

## 2015-11-26 DIAGNOSIS — N189 Chronic kidney disease, unspecified: Secondary | ICD-10-CM | POA: Diagnosis not present

## 2015-11-26 DIAGNOSIS — Y999 Unspecified external cause status: Secondary | ICD-10-CM | POA: Diagnosis not present

## 2015-11-26 DIAGNOSIS — Y929 Unspecified place or not applicable: Secondary | ICD-10-CM | POA: Insufficient documentation

## 2015-11-26 DIAGNOSIS — E7800 Pure hypercholesterolemia, unspecified: Secondary | ICD-10-CM | POA: Insufficient documentation

## 2015-11-26 DIAGNOSIS — Z7952 Long term (current) use of systemic steroids: Secondary | ICD-10-CM | POA: Insufficient documentation

## 2015-11-26 DIAGNOSIS — H9202 Otalgia, left ear: Secondary | ICD-10-CM

## 2015-11-26 LAB — CBC
HCT: 40 % (ref 35.0–47.0)
HEMOGLOBIN: 13.6 g/dL (ref 12.0–16.0)
MCH: 31.2 pg (ref 26.0–34.0)
MCHC: 33.9 g/dL (ref 32.0–36.0)
MCV: 91.9 fL (ref 80.0–100.0)
PLATELETS: 162 10*3/uL (ref 150–440)
RBC: 4.35 MIL/uL (ref 3.80–5.20)
RDW: 12.4 % (ref 11.5–14.5)
WBC: 6.8 10*3/uL (ref 3.6–11.0)

## 2015-11-26 LAB — URINALYSIS COMPLETE WITH MICROSCOPIC (ARMC ONLY)
BILIRUBIN URINE: NEGATIVE
GLUCOSE, UA: NEGATIVE mg/dL
Hgb urine dipstick: NEGATIVE
KETONES UR: NEGATIVE mg/dL
NITRITE: NEGATIVE
Protein, ur: NEGATIVE mg/dL
SPECIFIC GRAVITY, URINE: 1.013 (ref 1.005–1.030)
pH: 5 (ref 5.0–8.0)

## 2015-11-26 LAB — BASIC METABOLIC PANEL
ANION GAP: 8 (ref 5–15)
BUN: 14 mg/dL (ref 6–20)
CALCIUM: 9 mg/dL (ref 8.9–10.3)
CO2: 29 mmol/L (ref 22–32)
CREATININE: 0.71 mg/dL (ref 0.44–1.00)
Chloride: 100 mmol/L — ABNORMAL LOW (ref 101–111)
GFR calc Af Amer: >60 mL/min (ref >60)
GLUCOSE: 101 mg/dL — AB (ref 65–99)
Potassium: 4.2 mmol/L (ref 3.5–5.1)
Sodium: 137 mmol/L (ref 135–145)

## 2015-11-26 MED ORDER — ACETAMINOPHEN 500 MG PO TABS
1000.0000 mg | ORAL_TABLET | Freq: Once | ORAL | Status: AC
  Administered 2015-11-26: 1000 mg via ORAL
  Filled 2015-11-26: qty 2

## 2015-11-26 NOTE — Discharge Instructions (Signed)
Head Injury, Adult You have a head injury. Headaches and throwing up (vomiting) are common after a head injury. It should be easy to wake up from sleeping. Sometimes you must stay in the hospital. Most problems happen within the first 24 hours. Side effects may occur up to 7-10 days after the injury.  WHAT ARE THE TYPES OF HEAD INJURIES? Head injuries can be as minor as a bump. Some head injuries can be more severe. More severe head injuries include:  A jarring injury to the brain (concussion).  A bruise of the brain (contusion). This mean there is bleeding in the brain that can cause swelling.  A cracked skull (skull fracture).  Bleeding in the brain that collects, clots, and forms a bump (hematoma). WHEN SHOULD I GET HELP RIGHT AWAY?   You are confused or sleepy.  You cannot be woken up.  You feel sick to your stomach (nauseous) or keep throwing up (vomiting).  Your dizziness or unsteadiness is getting worse.  You have very bad, lasting headaches that are not helped by medicine. Take medicines only as told by your doctor.  You cannot use your arms or legs like normal.  You cannot walk.  You notice changes in the black spots in the center of the colored part of your eye (pupil).  You have clear or bloody fluid coming from your nose or ears.  You have trouble seeing. During the next 24 hours after the injury, you must stay with someone who can watch you. This person should get help right away (call 911 in the [place] if you start to shake and are not able to control it (have seizures), you pass out, or you are unable to wake up. HOW CAN I PREVENT A HEAD INJURY IN THE FUTURE?  Wear seat belts.  Wear a helmet while bike riding and playing sports like football.  Stay away from dangerous activities around the house. WHEN CAN I RETURN TO NORMAL ACTIVITIES AND ATHLETICS? See your doctor before doing these activities. You should not do normal activities or play contact sports until 1  week after the following symptoms have stopped:  Headache that does not go away.  Dizziness.  Poor attention.  Confusion.  Memory problems.  Sickness to your stomach or throwing up.  Tiredness.  Fussiness.  Bothered by bright lights or loud noises.  Anxiousness or depression.  Restless sleep. MAKE SURE YOU:   Understand these instructions.  Will watch your condition.  Will get help right away if you are not doing well or get worse.   This information is not intended to replace advice given to you by your health care provider. Make sure you discuss any questions you have with your health care provider.   Document Released: 07/02/2008 Document Revised: 08/10/2014 Document Reviewed: 03/27/2013 Elsevier Interactive Patient Education 2016 [place] An earache, also called otalgia, can be caused by many things. Pain from an earache can be sharp, dull, or burning. The pain may be temporary or constant. Earaches can be caused by problems with the ear, such as infection in either the middle ear or the ear canal, injury, impacted ear wax, middle ear pressure, or a foreign body in the ear. Ear pain can also result from problems in other areas. This is called referred pain. For example, pain can come from a sore throat, a tooth infection, or problems with the jaw or the joint between the jaw and the skull (temporomandibular joint, or TMJ). The cause of  an earache is not always easy to identify. Watchful waiting may be appropriate for some earaches until a clear cause of the pain can be found. HOME CARE INSTRUCTIONS Watch your condition for any changes. The following actions may help to lessen any discomfort that you are feeling:  Take medicines only as directed by your health care provider. This includes ear drops.  Apply ice to your outer ear to help reduce pain.  Put ice in a plastic bag.  Place a towel between your skin and the bag.  Leave the ice on for 20  minutes, 2-3 times per day.  Do not put anything in your ear other than medicine that is prescribed by your health care provider.  Try resting in an upright position instead of lying down. This may help to reduce pressure in the middle ear and relieve pain.  Chew gum if it helps to relieve your ear pain.  Control any allergies that you have.  Keep all follow-up visits as directed by your health care provider. This is important. SEEK MEDICAL CARE IF:  Your pain does not improve within 2 days.  You have a fever.  You have new or worsening symptoms. SEEK IMMEDIATE MEDICAL CARE IF:  You have a severe headache.  You have a stiff neck.  You have difficulty swallowing.  You have redness or swelling behind your ear.  You have drainage from your ear.  You have hearing loss.  You feel dizzy.   This information is not intended to replace advice given to you by your health care provider. Make sure you discuss any questions you have with your health care provider.   Document Released: 03/06/2004 Document Revised: 08/10/2014 Document Reviewed: 02/18/2014 Elsevier Interactive Patient Education 2016 [place] in the Home  Falls can cause injuries and can affect people from all age groups. There are many simple things that you can do to make your home safe and to help prevent falls. WHAT CAN I DO ON THE OUTSIDE OF MY HOME?  Regularly repair the edges of walkways and driveways and fix any cracks.  Remove high doorway thresholds.  Trim any shrubbery on the main path into your home.  Use bright outdoor lighting.  Clear walkways of debris and clutter, including tools and rocks.  Regularly check that handrails are securely fastened and in good repair. Both sides of any steps should have handrails.  Install guardrails along the edges of any raised decks or porches.  Have leaves, snow, and ice cleared regularly.  Use sand or salt on walkways during winter  months.  In the garage, clean up any spills right away, including grease or oil spills. WHAT CAN I DO IN THE BATHROOM?  Use night lights.  Install grab bars by the toilet and in the tub and shower. Do not use towel bars as grab bars.  Use non-skid mats or decals on the floor of the tub or shower.  If you need to sit down while you are in the shower, use a plastic, non-slip stool.[employer]  Keep the floor dry. Immediately clean up any water that spills on the floor.  Remove soap buildup in the tub or shower on a regular basis.  Attach bath mats securely with double-sided non-slip rug tape.  Remove throw rugs and other tripping hazards from the floor. WHAT CAN I DO IN THE BEDROOM?  Use night lights.  Make sure that a bedside light is easy to reach.  Do not use oversized bedding  that drapes onto the floor.  Have a firm chair that has side arms to use for getting dressed.  Remove throw rugs and other tripping hazards from the floor. WHAT CAN I DO IN THE KITCHEN?   Clean up any spills right away.  Avoid walking on wet floors.  Place frequently used items in easy-to-reach places.  If you need to reach for something above you, use a sturdy step stool that has a grab bar.  Keep electrical cables out of the way.  Do not use floor polish or wax that makes floors slippery. If you have to use wax, make sure that it is non-skid floor wax.  Remove throw rugs and other tripping hazards from the floor. WHAT CAN I DO IN THE STAIRWAYS?  Do not leave any items on the stairs.  Make sure that there are handrails on both sides of the stairs. Fix handrails that are broken or loose. Make sure that handrails are as long as the stairways.  Check any carpeting to make sure that it is firmly attached to the stairs. Fix any carpet that is loose or worn.  Avoid having throw rugs at the top or bottom of stairways, or secure the rugs with carpet tape to prevent them from moving.  Make sure that you  have a light switch at the top of the stairs and the bottom of the stairs. If you do not have them, have them installed. WHAT ARE SOME OTHER FALL PREVENTION TIPS?  Wear closed-toe shoes that fit well and support your feet. Wear shoes that have rubber soles or low heels.  When you use a stepladder, make sure that it is completely opened and that the sides are firmly locked. Have someone hold the ladder while you are using it. Do not climb a closed stepladder.  Add color or contrast paint or tape to grab bars and handrails in your home. Place contrasting color strips on the first and last steps.  Use mobility aids as needed, such as canes, walkers, scooters, and crutches.  Turn on lights if it is dark. Replace any light bulbs that burn out.  Set up furniture so that there are clear paths. Keep the furniture in the same spot.  Fix any uneven floor surfaces.  Choose a carpet design that does not hide the edge of steps of a stairway.  Be aware of any and all pets.  Review your medicines with your healthcare provider. Some medicines can cause dizziness or changes in blood pressure, which increase your risk of falling. Talk with your health care provider about other ways that you can decrease your risk of falls. This may include working with a physical therapist or trainer to improve your strength, balance, and endurance.   This information is not intended to replace advice given to you by your health care provider. Make sure you discuss any questions you have with your health care provider.   Document Released: 07/10/2002 Document Revised: 12/04/2014 Document Reviewed: 08/24/2014 Elsevier Interactive Patient Education [employer].

## 2015-11-26 NOTE — ED Provider Notes (Signed)
[facility] Emergency Department Provider Note     Time seen: ----------------------------------------- 5:12 PM on 11/26/2015 -----------------------------------------    I have reviewed the triage vital signs and the nursing notes.   HISTORY  Chief Complaint Fall and Head Injury    HPI CANSAS ELL is a 82 y.o. female who presents to ER after a fall. Patient states she got up in the middle of night and thinks her hip gave out. She has generalized aches as well as neck pain with headache. She has no abrasion on her left frontal scalp, contusion on her right knee and soreness in both shoulders. Patient states she has been able to walk since the fall.She also complains of soreness in the left ear   Past Medical History  Diagnosis Date  . Hypertension   . 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 Status post excision of acoustic neuroma 1988    RESIDUAL--  RIGHT HEARING LOSS, MILD IMPAIRED BALANCE, AND DRY MOUTH  . Balance problem MILD-- POST ACOUSTIC NEUROMA  . Dry mouth     POST ACOUSTIC NEUROMA  . Interstitial cystitis   . Chronic cystitis   . Incontinence of urine   . Frequency of urination   . Urgency of urination   . Nocturia   . Chronic back pain     CHRONIC NARCTIC -- GOES TO PAIN CLINIC  . SOB (shortness of breath) on exertion   . Constipated   . Fibromyalgia   . Lumbar stenosis   . Cataract immature BILATERAL  . Macular degeneration of both eyes   . Insomnia   . Anxiety   . Depression   . History of cervical cancer S/P VAG. HYSTECTOMY AGE 69  . Hypercholesteremia   . IBS (irritable bowel syndrome)   . Hemorrhoids   . Anemia   . GERD (gastroesophageal reflux disease)   . Ringing in ear     right  . Chronic kidney disease     polynephritis/freq. uti  . Cancer ([place])   . Asthma   . Headache     Patient Active Problem List   Diagnosis Date Noted  . Abnormal findings-gastrointestinal tract   . Gastritis   .  Hernia, hiatal   . Hematemesis with nausea   . ARF (acute renal failure) ([place]) 05/19/2015  . Anuria and oliguria 05/19/2015  . Chronic LBP 05/10/2015  . Chronic pain associated with significant psychosocial dysfunction 05/10/2015  . Encounter for other administrative examinations 05/10/2015  . Chronic interstitial cystitis 04/19/2015  . Cholelithiasis   . Hemorrhoid 10/09/2014  . H/O Malignant melanoma 10/09/2014  . Spinal stenosis 10/09/2014  . History of repair of rotator cuff 06/25/2014  . Complete rotator cuff rupture of left shoulder 06/15/2014  . DDD (degenerative disc disease), cervical 05/09/2014  . Airway hyperreactivity 04/03/2014  . Benign hypertension 04/03/2014  . Acid reflux 04/03/2014  . Hypercholesteremia 04/03/2014  . Fibrositis 05/24/2013  . Fibromyalgia 05/24/2013  . Apnea, sleep 01/31/2003    Past Surgical History  Procedure Laterality Date  . Total knee arthroplasty  2006    LEFT  . Replacement total knee  1999    RIGHT  . Revision total knee arthroplasty  X2    RIGHT  . Cervical conization w/bx  AGE 28    POST REPAIR OF BLEEDING  . Dilation and curettage of uterus  AGE 28  . Vaginal hysterectomy  AGE 28  . Appendectomy  AGE  23  .  Cystoscopy with urethral dilatation  AGE 89  . Tubal ligation  AGE 87  . Tonsillectomy and adenoidectomy  AGE 40  . Lipoma resection  1972    EXTENSIVE RIGHT HIP/THIGH  . Anterior and posterior repair  1973  . Neuroplasty / transposition median nerve at carpal tunnel bilateral  1980  . Craniectomy for excision of acoustic neuroma  1988  . Melanoma excision  1992    LEFT SHOULDER  . Cysto/ [place]  . Pubovaginal sling  1998  . Lumbar spine surgery  [place]    no metal  . Cystoscopy/retrograde/ureteroscopy  07/24/2011    Procedure: CYSTOSCOPY/RETROGRADE/URETEROSCOPY;  Surgeon: Ailene Rud, MD;  [facility];  Service: Urology;  Laterality: Right;  Cystoscopy Right  Ureteroscopy Bilateral RPG  C-ARM needed  **Patient has multiple allergies to medications**  **HX of A-fib & HTN**  . Cystoscopy with hydrodistension and biopsy  07/24/2011    Procedure: CYSTOSCOPY/BIOPSY/HYDRODISTENSION;  Surgeon: Ailene Rud, MD;  Location: [facility];  Service: Urology;  Laterality: Right;  HOD Poss Biopsy of Rt Ureteral Lesion Meatal Dilation   . Breast cyst excision Left AGE 81  . Joint replacement Bilateral 2008    right knee revision x 3.   . Cholecystectomy N/A 04/01/2015    Procedure: LAPAROSCOPIC CHOLECYSTECTOMY WITH INTRAOPERATIVE CHOLANGIOGRAM;  Surgeon: Marlyce Huge, MD;  Location: [facility];  Service: General;  Laterality: N/A;  . Brain surgery    . Esophagogastroduodenoscopy (egd) with propofol N/A 07/02/2015    Procedure: ESOPHAGOGASTRODUODENOSCOPY (EGD) WITH PROPOFOL;  Surgeon: Lucilla Lame, MD;  Location: [facility];  Service: Endoscopy;  Laterality: N/A;    Allergies Cobalamine combinations; Iodinated diagnostic agents; Ioxaglate; Amitriptyline; Butalbital-aspirin-caffeine; Furacin; Penicillins; and Sulfa antibiotics  Social History Social History  Substance Use Topics  . Smoking status: Former Smoker -- 1.50 packs/day    Types: Cigarettes    Quit date: 07/22/1991  . Smokeless tobacco: Never Used  . Alcohol Use: Yes     Comment: RARE    Review of Systems Constitutional: Negative for fever. Eyes: Negative for visual changes. ENT: Negative for sore throat. Cardiovascular: Negative for chest pain. Respiratory: Negative for shortness of breath. Gastrointestinal: Negative for abdominal pain, vomiting and diarrhea. Genitourinary: Negative for dysuria. Musculoskeletal: Positive for neck pain, right knee pain, bilateral shoulder pain Skin: Positive for right knee abrasion, left forehead abrasion Neurological: Positive for headache  10-point ROS otherwise  negative.  ____________________________________________   PHYSICAL EXAM:  VITAL SIGNS: ED Triage Vitals  Enc Vitals Group     BP 11/26/15 1612 122/62 mmHg     Pulse Rate 11/26/15 1612 76     Resp 11/26/15 1612 18     Temp 11/26/15 1612 98.1 F (36.7 C)     Temp Source 11/26/15 1612 Oral     SpO2 11/26/15 1612 95 %     Weight 11/26/15 1612 180 lb (81.647 kg)     Height 11/26/15 1612 5\' 8"  (1.727 m)     Head Cir --      Peak Flow --      Pain Score 11/26/15 1612 6     Pain Loc --      Pain Edu? --      Excl. in Lupus? --     Constitutional: Alert and oriented. No distress Eyes: Conjunctivae are normal. PERRL. Normal extraocular movements. ENT   Head: Left frontal scalp contusion   Nose: No congestion/rhinnorhea.  Mouth/Throat: Mucous membranes are moist.   Neck: No stridor. Cardiovascular: Normal rate, regular rhythm. No murmurs, rubs, or gallops. Respiratory: Normal respiratory effort without tachypnea nor retractions. Breath sounds are clear and equal bilaterally. No wheezes/rales/rhonchi. Gastrointestinal: Soft and nontender. Normal bowel sounds Musculoskeletal: Nontender with limited but normal range of motion in all extremities. Mild right knee tenderness Neurologic:  Normal speech and language. No gross focal neurologic deficits are appreciated.  Skin:  Left frontal scalp contusion, right knee abrasion Psychiatric: Mood and affect are normal. Speech and behavior are normal.  ____________________________________________  EKG: Interpreted by me. Normal sinus rhythm with sinus arrhythmia, rate is 66 bpm, normal PR interval, normal QRS, normal QT interval. Normal axis.  ____________________________________________  ED COURSE:  Pertinent labs & imaging results that were available during my care of the patient were reviewed by me and considered in my medical decision making (see chart for details). Patient is in no acute distress, it sounds like a mechanical  fall. She I will obtain x-rays, basic labs and reevaluate. ____________________________________________    LABS (pertinent positives/negatives)  Labs Reviewed  BASIC METABOLIC PANEL - Abnormal; Notable for the following:    Chloride 100 (*)    Glucose, Bld 101 (*)    All other components within normal limits  URINALYSIS COMPLETEWITH MICROSCOPIC ([facility] ONLY) - Abnormal; Notable for the following:    Color, Urine YELLOW (*)    APPearance HAZY (*)    Leukocytes, UA TRACE (*)    Bacteria, UA RARE (*)    Squamous Epithelial / LPF 6-30 (*)    All other components within normal limits  CBC  CBG MONITORING, ED    RADIOLOGY Images were viewed by me  CT head, C-spine, right knee x-rays IMPRESSION: CT of the head: Postsurgical changes. No acute abnormality noted.  CT of the cervical spine: Multilevel degenerative change without acute abnormality.  ____________________________________________  FINAL ASSESSMENT AND PLAN  Fall, minor head injury  Plan: Patient with labs and imaging as dictated above. Patient is in no acute distress, x-rays are unremarkable and I ambulated her in the ER myself. She walks around without any difficulty. She stable for outpatient follow-up with her doctor.   Earleen Newport, MD   Earleen Newport, MD 11/26/15 [phone]

## 2015-11-26 NOTE — ED Notes (Signed)
States she got up in the middle of the night and woke up on the floor, states hx of acoustic neuroma, states generalized aches and neck pain with a headache

## 2016-03-31 DIAGNOSIS — H6690 Otitis media, unspecified, unspecified ear: Secondary | ICD-10-CM

## 2016-03-31 HISTORY — DX: Otitis media, unspecified, unspecified ear: H66.90

## 2016-04-08 ENCOUNTER — Ambulatory Visit: Payer: Self-pay | Admitting: Orthopedic Surgery

## 2016-04-23 ENCOUNTER — Encounter (HOSPITAL_COMMUNITY): Payer: Self-pay

## 2016-04-23 ENCOUNTER — Encounter (HOSPITAL_COMMUNITY)
Admission: RE | Admit: 2016-04-23 | Discharge: 2016-04-23 | Disposition: A | Discharge status: Home / Self Care | Payer: Medicare Other | Source: Ambulatory Visit | Attending: Orthopedic Surgery | Admitting: Orthopedic Surgery

## 2016-04-23 DIAGNOSIS — Z01818 Encounter for other preprocedural examination: Secondary | ICD-10-CM | POA: Insufficient documentation

## 2016-04-23 HISTORY — DX: Otitis media, unspecified, unspecified ear: H66.90

## 2016-04-23 LAB — COMPREHENSIVE METABOLIC PANEL
ALBUMIN: 4.4 g/dL (ref 3.5–5.0)
ALK PHOS: 97 U/L (ref 38–126)
ALT: 46 U/L (ref 14–54)
ANION GAP: 7 (ref 5–15)
AST: 61 U/L — ABNORMAL HIGH (ref 15–41)
BILIRUBIN TOTAL: 0.7 mg/dL (ref 0.3–1.2)
BUN: 16 mg/dL (ref 6–20)
CALCIUM: 9.2 mg/dL (ref 8.9–10.3)
CO2: 30 mmol/L (ref 22–32)
Chloride: 102 mmol/L (ref 101–111)
Creatinine, Ser: 0.76 mg/dL (ref 0.44–1.00)
GFR calc non Af Amer: >60 mL/min (ref >60)
GLUCOSE: 102 mg/dL — AB (ref 65–99)
POTASSIUM: 4.3 mmol/L (ref 3.5–5.1)
SODIUM: 139 mmol/L (ref 135–145)
TOTAL PROTEIN: 8 g/dL (ref 6.5–8.1)

## 2016-04-23 LAB — CBC
HCT: 41.8 % (ref 36.0–46.0)
Hemoglobin: 13.8 g/dL (ref 12.0–15.0)
MCH: 30.4 pg (ref 26.0–34.0)
MCHC: 33 g/dL (ref 30.0–36.0)
MCV: 92.1 fL (ref 78.0–100.0)
Platelets: 191 10*3/uL (ref 150–400)
RBC: 4.54 MIL/uL (ref 3.87–5.11)
RDW: 12.5 % (ref 11.5–15.5)
WBC: 7.3 10*3/uL (ref 4.0–10.5)

## 2016-04-23 LAB — PROTIME-INR
INR: 0.98
Prothrombin Time: 12.9 seconds (ref 11.4–15.2)

## 2016-04-23 LAB — SURGICAL PCR SCREEN
MRSA, PCR: NEGATIVE
Staphylococcus aureus: NEGATIVE

## 2016-04-23 LAB — APTT: APTT: 32 s (ref 24–36)

## 2016-04-23 NOTE — Patient Instructions (Addendum)
Kathleen Bates  04/23/2016   Your procedure is scheduled on: Wednesday 04/29/2016  Report to [facility] Main  Entrance take [place]  elevators to 3rd floor to  [place] at  [place] AM.  Call this number if you have problems the morning of surgery [phone]   Remember: ONLY 1 PERSON MAY GO WITH YOU TO SHORT STAY TO GET  READY MORNING OF [place].   Do not eat food or drink liquids :After Midnight.     Take these medicines the morning of surgery with A SIP OF WATER: Amlodipine, Fluoxetine (Prozac), Oxycodone                                 You may not have any metal on your body including hair pins and              piercings  Do not wear jewelry, make-up, lotions, powders or perfumes, deodorant             Do not wear nail polish.  Do not shave  48 hours prior to surgery.              Men may shave face and neck.   Do not bring valuables to the hospital. [place].  Contacts, dentures or bridgework may not be worn into surgery.  Leave suitcase in the car. After surgery it may be brought to your room.                  Please read over the following fact sheets you were given: _____________________________________________________________________             [facility] - Preparing for Surgery Before surgery, you can play an important role.  Because skin is not sterile, your skin needs to be as free of germs as possible.  You can reduce the number of germs on your skin by washing with CHG (chlorahexidine gluconate) soap before surgery.  CHG is an antiseptic cleaner which kills germs and bonds with the skin to continue killing germs even after washing. Please DO NOT use if you have an allergy to CHG or antibacterial soaps.  If your skin becomes reddened/irritated stop using the CHG and inform your nurse when you arrive at Short Stay. Do not shave (including legs and underarms) for at least 48 hours prior  to the first CHG shower.  You may shave your face/neck. Please follow these instructions carefully:  1.  Shower with CHG Soap the night before surgery and the  morning of Surgery.  2.  If you choose to wash your hair, wash your hair first as usual with your  normal  shampoo.  3.  After you shampoo, rinse your hair and body thoroughly to remove the  shampoo.                           4.  Use CHG as you would any other liquid soap.  You can apply chg directly  to the skin and wash                       Gently with a scrungie or clean washcloth.  5.  Apply the CHG Soap to your body ONLY FROM THE NECK DOWN.   Do not use on face/ open                           Wound or open sores. Avoid contact with eyes, ears mouth and genitals (private parts).                       Wash face,  Genitals (private parts) with your normal soap.             6.  Wash thoroughly, paying special attention to the area where your surgery  will be performed.  7.  Thoroughly rinse your body with warm water from the neck down.  8.  DO NOT shower/wash with your normal soap after using and rinsing off  the CHG Soap.                9.  Pat yourself dry with a clean towel.            10.  Wear clean pajamas.            11.  Place clean sheets on your bed the night of your first shower and do not  sleep with pets. Day of Surgery : Do not apply any lotions/deodorants the morning of surgery.  Please wear clean clothes to the hospital/surgery center.  FAILURE TO FOLLOW THESE INSTRUCTIONS MAY RESULT IN THE CANCELLATION OF YOUR SURGERY PATIENT SIGNATURE_________________________________  NURSE SIGNATURE__________________________________  ________________________________________________________________________   Adam Phenix  An incentive spirometer is a tool that can help keep your lungs clear and active. This tool measures how well you are filling your lungs with each breath. Taking long deep breaths may help reverse or  decrease the chance of developing breathing (pulmonary) problems (especially infection) following:  A long period of time when you are unable to move or be active. BEFORE THE PROCEDURE   If the spirometer includes an indicator to show your best effort, your nurse or respiratory therapist will set it to a desired goal.  If possible, sit up straight or lean slightly forward. Try not to slouch.  Hold the incentive spirometer in an upright position. INSTRUCTIONS FOR USE  1. Sit on the edge of your bed if possible, or sit up as far as you can in bed or on a chair. 2. Hold the incentive spirometer in an upright position. 3. Breathe out normally. 4. Place the mouthpiece in your mouth and seal your lips tightly around it. 5. Breathe in slowly and as deeply as possible, raising the piston or the ball toward the top of the column. 6. Hold your breath for 3-5 seconds or for as long as possible. Allow the piston or ball to fall to the bottom of the column. 7. Remove the mouthpiece from your mouth and breathe out normally. 8. Rest for a few seconds and repeat Steps 1 through 7 at least 10 times every 1-2 hours when you are awake. Take your time and take a few normal breaths between deep breaths. 9. The spirometer may include an indicator to show your best effort. Use the indicator as a goal to work toward during each repetition. 10. After each set of 10 deep breaths, practice coughing to be sure your lungs are clear. If you have an incision (the cut made at the time of surgery), support your incision when coughing by placing a  pillow or rolled up towels firmly against it. Once you are able to get out of bed, walk around indoors and cough well. You may stop using the incentive spirometer when instructed by your caregiver.  RISKS AND COMPLICATIONS  Take your time so you do not get dizzy or light-headed.  If you are in pain, you may need to take or ask for pain medication before doing incentive spirometry.  It is harder to take a deep breath if you are having pain. AFTER USE  Rest and breathe slowly and easily.  It can be helpful to keep track of a log of your progress. Your caregiver can provide you with a simple table to help with this. If you are using the spirometer at home, follow these instructions: [place] IF:   You are having difficultly using the spirometer.  You have trouble using the spirometer as often as instructed.  Your pain medication is not giving enough relief while using the spirometer.  You develop fever of 100.5 F (38.1 C) or higher. SEEK IMMEDIATE MEDICAL CARE IF:   You cough up bloody sputum that had not been present before.  You develop fever of 102 F (38.9 C) or greater.  You develop worsening pain at or near the incision site. MAKE SURE YOU:   Understand these instructions.  Will watch your condition.  Will get help right away if you are not doing well or get worse. Document Released: 11/30/2006 Document Revised: 10/12/2011 Document Reviewed: 01/31/2007 Exit[employer] Patient Information 2014 [employer], [place].   ________________________________________________________________________  WHAT IS A BLOOD TRANSFUSION? Blood Transfusion Information  A transfusion is the replacement of blood or some of its parts. Blood is made up of multiple cells which provide different functions.  Red blood cells carry oxygen and are used for blood loss replacement.  White blood cells fight against infection.  Platelets control bleeding.  Plasma helps clot blood.  Other blood products are available for specialized needs, such as hemophilia or other clotting disorders. BEFORE THE TRANSFUSION  Who gives blood for transfusions?   Healthy volunteers who are fully evaluated to make sure their blood is safe. This is blood bank blood. Transfusion therapy is the safest it has ever been in the practice of medicine. Before blood is taken from a donor, a complete  history is taken to make sure that person has no history of diseases nor engages in risky social behavior (examples are intravenous drug use or sexual activity with multiple partners). The donor's travel history is screened to minimize risk of transmitting infections, such as malaria. The donated blood is tested for signs of infectious diseases, such as HIV and hepatitis. The blood is then tested to be sure it is compatible with you in order to minimize the chance of a transfusion reaction. If you or a relative donates blood, this is often done in anticipation of surgery and is not appropriate for emergency situations. It takes many days to process the donated blood. RISKS AND COMPLICATIONS Although transfusion therapy is very safe and saves many lives, the main dangers of transfusion include:   Getting an infectious disease.  Developing a transfusion reaction. This is an allergic reaction to something in the blood you were given. Every precaution is taken to prevent this. The decision to have a blood transfusion has been considered carefully by your caregiver before blood is given. Blood is not given unless the benefits outweigh the risks. AFTER THE TRANSFUSION  Right after receiving a blood transfusion, you  will usually feel much better and more energetic. This is especially true if your red blood cells have gotten low (anemic). The transfusion raises the level of the red blood cells which carry oxygen, and this usually causes an energy increase.  The nurse administering the transfusion will monitor you carefully for complications. HOME CARE INSTRUCTIONS  No special instructions are needed after a transfusion. You may find your energy is better. Speak with your caregiver about any limitations on activity for underlying diseases you may have. SEEK MEDICAL CARE IF:   Your condition is not improving after your transfusion.  You develop redness or irritation at the intravenous (IV) site. SEEK  IMMEDIATE MEDICAL CARE IF:  Any of the following symptoms occur over the next 12 hours:  Shaking chills.  You have a temperature by mouth above 102 F (38.9 C), not controlled by medicine.  Chest, back, or muscle pain.  People around you feel you are not acting correctly or are confused.  Shortness of breath or difficulty breathing.  Dizziness and fainting.  You get a rash or develop hives.  You have a decrease in urine output.  Your urine turns a dark color or changes to pink, red, or brown. Any of the following symptoms occur over the next 10 days:  You have a temperature by mouth above 102 F (38.9 C), not controlled by medicine.  Shortness of breath.  Weakness after normal activity.  The white part of the eye turns yellow (jaundice).  You have a decrease in the amount of urine or are urinating less often.  Your urine turns a dark color or changes to pink, red, or brown. Document Released: 07/17/2000 Document Revised: 10/12/2011 Document Reviewed: 03/05/2008 [facility] Patient Information 2014 [place], [place].  _______________________________________________________________________

## 2016-04-24 ENCOUNTER — Other Ambulatory Visit (HOSPITAL_COMMUNITY): Payer: Self-pay | Admitting: *Deleted

## 2016-04-24 LAB — URINALYSIS, ROUTINE W REFLEX MICROSCOPIC
Bilirubin Urine: NEGATIVE
GLUCOSE, UA: NEGATIVE mg/dL
Hgb urine dipstick: NEGATIVE
Ketones, ur: NEGATIVE mg/dL
LEUKOCYTES UA: NEGATIVE
Nitrite: NEGATIVE
PH: 6.5 (ref 5.0–8.0)
Protein, ur: NEGATIVE mg/dL
Specific Gravity, Urine: 1.015 (ref 1.005–1.030)

## 2016-04-24 LAB — ABO/RH: ABO/RH(D): A POS

## 2016-04-28 ENCOUNTER — Ambulatory Visit: Payer: Self-pay | Admitting: Orthopedic Surgery

## 2016-04-28 NOTE — H&P (Signed)
Kathleen Bates DOB: 11-Apr-1934 Married / Language: English / Race: White Female Date of Admission:  04/29/16 CC:  Left hip pain History of Present Illness The patient is a 82 year old female who comes in for a preoperative History and Physical. The patient is scheduled for a left total hip arthroplasty (anterior) to be performed by Dr. Dione Plover. Aluisio, MD at [facility] on 04/29/2016. The patient is a 82 year old female who presented for a follow-up for Follow-up Hip. The patient is being followed for their left hip pain and osteoarthritis. They are now month(s) out from IA injection. Symptoms reported include: pain, giving way (had a fall 2-3 weeks ago) and difficulty ambulating. The patient feels that they are doing poorly and report their pain level to be 6 / 10. Current treatment includes: activity modification and pain medications. The following medication has been used for pain control: Oxycodone. Kathleen Bates has had the I-A several months ago and was last seen in Feb this year. She is now at a point where she is afraid not to do the surgery. Her hip has gotten a lot worse since we saw her in February. She is feeling the pain is worse and her function is worsening also. She is afraid she is going to fall because the leg may give out on her. She is at stage where she is now ready to proceed with surgery. They have been treated conservatively in the past for the above stated problem and despite conservative measures, they continue to have progressive pain and severe functional limitations and dysfunction. They have failed non-operative management including home exercise, medications, and injections. It is felt that they would benefit from undergoing total joint replacement. Risks and benefits of the procedure have been discussed with the patient and they elect to proceed with surgery. There are no active contraindications to surgery such as ongoing infection or rapidly progressive neurological  disease.    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 Sulfa 10 *OPHTHALMIC AGENTS*  Rash, Swelling. Furacin *DERMATOLOGICALS*  Rash, Swelling. Allergies Reconciled  Nickel  She has bilateral total knees  Family History Cancer  father Cerebrovascular Accident  mother Depression  sister Diabetes Mellitus  brother Drug / Alcohol Addiction  child Heart disease in female family member before age 17  Hypertension  mother, brother and grandfather mothers side Osteoarthritis  grandmother fathers side Severe allergy  mother  Social History Alcohol use  current drinker; drinks wine; only occasionally per week Children  4 Current work status  retired Engineer, agricultural (Currently)  no Drug/Alcohol Rehab (Previously)  no Exercise  does running / walking Illicit drug use  no Living situation  live with spouse Marital status  married Number of flights of stairs before winded  less than 1 Pain Contract  yes Tobacco / smoke exposure  no Tobacco use  Former smoker. former  smoker; smoke(d) 1 1/2 pack(s) per day  Medication History OxyCODONE HCl (Oral) Specific strength unknown - Active. Benazepril HCl (40MG  Tablet, Oral) Active. Amlodipine Besy-Benazepril HCl (5-40MG   Capsule, Oral) Active. Pepcid (Oral) Specific strength unknown - Active. ALPRAZolam (0.25MG  Tablet, Oral) Active. OxyCONTIN (80MG  Tablet ER 12HR, Oral) Active. FLUoxetine HCl (40MG  Capsule, Oral) Active.  Past Surgical History  Low Back Disc Surgery  Ankle Surgery  bilateral Appendectomy  Arthroscopy of Knee  bilateral Breast Biopsy  left Breast Mass; Local Excision  left Carpal Tunnel Repair  bilateral Dilation and Curettage of Uterus - Multiple  Foot Surgery  bilateral Gallbladder Surgery  laporoscopic Hysterectomy  partial (cancerous) Rotator Cuff Repair  right Spinal Decompression  lower back Spinal Surgery  Straighten Nasal Septum  Tonsillectomy  Total Knee Replacement  bilateral Tubal Ligation    Review of Systems  General Present- Fatigue and Night Sweats. Not Present- Chills, Fever, Memory Loss, Weight Gain and Weight Loss. Skin Not Present- Eczema, Hives, Itching, Lesions and Rash. HEENT Present- Headache, Hearing Loss and Tinnitus. Not Present- Dentures, Double Vision and Visual Loss. Respiratory Present- Cough, Shortness of breath with exertion and Wheezing. Not Present- Allergies, Chronic Cough, Coughing up blood and Shortness of breath at rest. Cardiovascular Not Present- Chest Pain, Difficulty Breathing Lying Down, Murmur, Palpitations, Racing/skipping heartbeats and Swelling. Gastrointestinal Present- Abdominal Pain (at times). Not Present- Bloody Stool, Constipation, Diarrhea, Difficulty Swallowing, Heartburn, Jaundice, Loss of appetitie, Nausea and Vomiting. Female Genitourinary Present- Urinary frequency, Urinary Retention, Urinating at Night and Weak urinary stream. Not Present- Blood in Urine, Discharge, Flank Pain, Incontinence, Painful Urination and Urgency. Musculoskeletal Present- Back Pain, Joint Pain, Morning Stiffness, Muscle Pain and Spasms. Not Present- Joint Swelling and Muscle Weakness. Neurological Not Present- Blackout  spells, Difficulty with balance, Dizziness, Paralysis, Tremor and Weakness. Psychiatric Present- Insomnia.  Vitals  Weight: 186 lb Height: 68in Body Surface Area: 1.98 m Body Mass Index: 28.28 kg/m  Pulse: 76 (Regular)  BP: 128/82 (Sitting, Left Arm, Standard)  Physical Exam  General Mental Status -Alert, cooperative and good historian. General Appearance-pleasant, Not in acute distress. Orientation-Oriented X3. Build & Nutrition-Well nourished and Well developed.  Head and Neck Head-normocephalic, atraumatic . Neck Global Assessment - supple, no bruit auscultated on the right, no bruit auscultated on the left.  Eye Vision-Wears corrective lenses(readers only). Pupil - Bilateral-Regular and Round. Motion - Bilateral-EOMI.  ENMT Note: upper denture plate   Chest and Lung Exam Auscultation Breath sounds - clear at anterior chest wall and clear at posterior chest wall. Adventitious sounds - No Adventitious sounds.  Cardiovascular Auscultation Rhythm - Regular rate and rhythm. Heart Sounds - S1 WNL and S2 WNL. Murmurs & Other Heart Sounds - Auscultation of the heart reveals - No Murmurs.  Abdomen Palpation/Percussion Tenderness - Abdomen is non-tender to palpation. Rigidity (guarding) - Abdomen is soft. Auscultation Auscultation of the abdomen reveals - Bowel sounds normal.  Female Genitourinary Note: Not done, not pertinent to present illness   Musculoskeletal Note: She is alert and oriented, in no apparent distress. Evaluation of her right hip, normal motion, no discomfort. Left hip, flexion about 100, minimal internal rotation, about 20 external rotation, 20 abduction. She is nontender about the hip. She has a significantly antalgic gait pattern.  RADIOGRAPHS AP pelvis and lateral of the hip shows that she is now essentially bone on bone with subchondral cystic formation. The x-rays have worsened from last visit.   Assessment &  Plan Primary osteoarthritis of left hip (M16.12) Aftercare following left knee  joint replacement surgery (Z47.1, [id number])  Note:Surgical Plans: Left Total Hip Replacement - Anterior Approach  Disposition: Home  PCP: Dr. Governor Specking [facility]  Topical TXA  Anesthesia Issues: None  Signed electronically by Ok Edwards, III PA-C

## 2016-04-29 ENCOUNTER — Inpatient Hospital Stay (HOSPITAL_COMMUNITY): Payer: Medicare Other | Admitting: Anesthesiology

## 2016-04-29 ENCOUNTER — Encounter (HOSPITAL_COMMUNITY)
Admission: RE | Disposition: A | Discharge status: Home Health | Payer: Self-pay | Source: Ambulatory Visit | Attending: Orthopedic Surgery

## 2016-04-29 ENCOUNTER — Encounter (HOSPITAL_COMMUNITY): Payer: Self-pay

## 2016-04-29 ENCOUNTER — Inpatient Hospital Stay (HOSPITAL_COMMUNITY): Payer: Medicare Other

## 2016-04-29 ENCOUNTER — Inpatient Hospital Stay (HOSPITAL_COMMUNITY)
Admission: RE | Admit: 2016-04-29 | Discharge: 2016-05-01 | DRG: 470 | Disposition: A | Discharge status: Home Health | Payer: Medicare Other | Source: Ambulatory Visit | Attending: Orthopedic Surgery | Admitting: Orthopedic Surgery

## 2016-04-29 DIAGNOSIS — N189 Chronic kidney disease, unspecified: Secondary | ICD-10-CM | POA: Diagnosis present

## 2016-04-29 DIAGNOSIS — H3530 Unspecified macular degeneration: Secondary | ICD-10-CM | POA: Diagnosis present

## 2016-04-29 DIAGNOSIS — Z79891 Long term (current) use of opiate analgesic: Secondary | ICD-10-CM

## 2016-04-29 DIAGNOSIS — E7800 Pure hypercholesterolemia, unspecified: Secondary | ICD-10-CM | POA: Diagnosis present

## 2016-04-29 DIAGNOSIS — G8929 Other chronic pain: Secondary | ICD-10-CM | POA: Diagnosis present

## 2016-04-29 DIAGNOSIS — I129 Hypertensive chronic kidney disease with stage 1 through stage 4 chronic kidney disease, or unspecified chronic kidney disease: Secondary | ICD-10-CM | POA: Diagnosis present

## 2016-04-29 DIAGNOSIS — F329 Major depressive disorder, single episode, unspecified: Secondary | ICD-10-CM | POA: Diagnosis present

## 2016-04-29 DIAGNOSIS — M4806 Spinal stenosis, lumbar region: Secondary | ICD-10-CM | POA: Diagnosis present

## 2016-04-29 DIAGNOSIS — Z880 Allergy status to penicillin: Secondary | ICD-10-CM | POA: Diagnosis not present

## 2016-04-29 DIAGNOSIS — J45909 Unspecified asthma, uncomplicated: Secondary | ICD-10-CM | POA: Diagnosis present

## 2016-04-29 DIAGNOSIS — G4700 Insomnia, unspecified: Secondary | ICD-10-CM | POA: Diagnosis present

## 2016-04-29 DIAGNOSIS — F17210 Nicotine dependence, cigarettes, uncomplicated: Secondary | ICD-10-CM | POA: Diagnosis present

## 2016-04-29 DIAGNOSIS — M549 Dorsalgia, unspecified: Secondary | ICD-10-CM | POA: Diagnosis present

## 2016-04-29 DIAGNOSIS — M169 Osteoarthritis of hip, unspecified: Secondary | ICD-10-CM | POA: Diagnosis present

## 2016-04-29 DIAGNOSIS — H269 Unspecified cataract: Secondary | ICD-10-CM | POA: Diagnosis present

## 2016-04-29 DIAGNOSIS — Z96653 Presence of artificial knee joint, bilateral: Secondary | ICD-10-CM | POA: Diagnosis present

## 2016-04-29 DIAGNOSIS — K589 Irritable bowel syndrome without diarrhea: Secondary | ICD-10-CM | POA: Diagnosis present

## 2016-04-29 DIAGNOSIS — Z8541 Personal history of malignant neoplasm of cervix uteri: Secondary | ICD-10-CM

## 2016-04-29 DIAGNOSIS — F419 Anxiety disorder, unspecified: Secondary | ICD-10-CM | POA: Diagnosis present

## 2016-04-29 DIAGNOSIS — Z882 Allergy status to sulfonamides status: Secondary | ICD-10-CM

## 2016-04-29 DIAGNOSIS — K5900 Constipation, unspecified: Secondary | ICD-10-CM | POA: Diagnosis present

## 2016-04-29 DIAGNOSIS — Z888 Allergy status to other drugs, medicaments and biological substances status: Secondary | ICD-10-CM | POA: Diagnosis not present

## 2016-04-29 DIAGNOSIS — Z96649 Presence of unspecified artificial hip joint: Secondary | ICD-10-CM

## 2016-04-29 DIAGNOSIS — H9191 Unspecified hearing loss, right ear: Secondary | ICD-10-CM | POA: Diagnosis present

## 2016-04-29 DIAGNOSIS — Z79899 Other long term (current) drug therapy: Secondary | ICD-10-CM | POA: Diagnosis not present

## 2016-04-29 DIAGNOSIS — K219 Gastro-esophageal reflux disease without esophagitis: Secondary | ICD-10-CM | POA: Diagnosis present

## 2016-04-29 DIAGNOSIS — M797 Fibromyalgia: Secondary | ICD-10-CM | POA: Diagnosis present

## 2016-04-29 DIAGNOSIS — M1612 Unilateral primary osteoarthritis, left hip: Secondary | ICD-10-CM | POA: Diagnosis present

## 2016-04-29 HISTORY — PX: TOTAL HIP ARTHROPLASTY: SHX124

## 2016-04-29 LAB — TYPE AND SCREEN
ABO/RH(D): A POS
ANTIBODY SCREEN: NEGATIVE

## 2016-04-29 SURGERY — ARTHROPLASTY, HIP, TOTAL, ANTERIOR APPROACH
Anesthesia: General | Site: Hip | Laterality: Left

## 2016-04-29 MED ORDER — EPHEDRINE SULFATE 50 MG/ML IJ SOLN
INTRAMUSCULAR | Status: DC | PRN
  Administered 2016-04-29 (×2): 10 mg via INTRAVENOUS

## 2016-04-29 MED ORDER — ONDANSETRON HCL 4 MG/2ML IJ SOLN: 4.0000 mg | Freq: Four times a day (QID) | INTRAMUSCULAR | Status: DC | PRN

## 2016-04-29 MED ORDER — PROMETHAZINE HCL 25 MG/ML IJ SOLN
6.2500 mg | INTRAMUSCULAR | Status: DC | PRN
Start: 2016-04-29 — End: 2016-04-29

## 2016-04-29 MED ORDER — DIPHENHYDRAMINE HCL 12.5 MG/5ML PO ELIX: 12.5000 mg | ORAL_SOLUTION | ORAL | Status: DC | PRN

## 2016-04-29 MED ORDER — SUGAMMADEX SODIUM 200 MG/2ML IV SOLN
INTRAVENOUS | Status: AC
  Filled 2016-04-29: qty 2

## 2016-04-29 MED ORDER — ALPRAZOLAM 0.25 MG PO TABS
0.2500 mg | ORAL_TABLET | Freq: Every day | ORAL | Status: DC
  Administered 2016-04-29 – 2016-04-30 (×2): 0.25 mg via ORAL
  Filled 2016-04-29 (×2): qty 1

## 2016-04-29 MED ORDER — VANCOMYCIN HCL IN DEXTROSE 1-5 GM/200ML-% IV SOLN
1000.0000 mg | Freq: Once | INTRAVENOUS | Status: DC
  Filled 2016-04-29: qty 200

## 2016-04-29 MED ORDER — ONDANSETRON HCL 4 MG PO TABS: 4.0000 mg | ORAL_TABLET | Freq: Four times a day (QID) | ORAL | Status: DC | PRN

## 2016-04-29 MED ORDER — BISACODYL 10 MG RE SUPP: 10.0000 mg | Freq: Every day | RECTAL | Status: DC | PRN

## 2016-04-29 MED ORDER — FAMOTIDINE 20 MG PO TABS: 40.0000 mg | ORAL_TABLET | Freq: Two times a day (BID) | ORAL | Status: DC | PRN

## 2016-04-29 MED ORDER — ACETAMINOPHEN 325 MG PO TABS
650.0000 mg | ORAL_TABLET | Freq: Four times a day (QID) | ORAL | Status: DC | PRN
  Administered 2016-04-30 – 2016-05-01 (×4): 650 mg via ORAL
  Filled 2016-04-29 (×4): qty 2

## 2016-04-29 MED ORDER — DEXAMETHASONE SODIUM PHOSPHATE 10 MG/ML IJ SOLN
INTRAMUSCULAR | Status: AC
  Filled 2016-04-29: qty 1

## 2016-04-29 MED ORDER — OXYCODONE HCL 5 MG PO TABS
10.0000 mg | ORAL_TABLET | ORAL | Status: DC | PRN
  Administered 2016-04-29: 20 mg via ORAL
  Administered 2016-04-29: 15 mg via ORAL
  Administered 2016-04-29 – 2016-04-30 (×4): 20 mg via ORAL
  Administered 2016-04-30: 10 mg via ORAL
  Administered 2016-04-30 – 2016-05-01 (×8): 20 mg via ORAL
  Filled 2016-04-29 (×6): qty 4
  Filled 2016-04-29: qty 2
  Filled 2016-04-29 (×3): qty 4
  Filled 2016-04-29: qty 3
  Filled 2016-04-29 (×5): qty 4

## 2016-04-29 MED ORDER — FLEET ENEMA 7-19 GM/118ML RE ENEM: 1.0000 | ENEMA | Freq: Once | RECTAL | Status: DC | PRN

## 2016-04-29 MED ORDER — OXYCODONE HCL ER 20 MG PO T12A
40.0000 mg | EXTENDED_RELEASE_TABLET | ORAL | Status: DC
  Administered 2016-04-29 – 2016-05-01 (×5): 40 mg via ORAL
  Filled 2016-04-29 (×5): qty 2

## 2016-04-29 MED ORDER — DIPHENHYDRAMINE HCL 25 MG PO CAPS: 25.0000 mg | ORAL_CAPSULE | Freq: Four times a day (QID) | ORAL | Status: DC | PRN

## 2016-04-29 MED ORDER — BISACODYL 5 MG PO TBEC
10.0000 mg | DELAYED_RELEASE_TABLET | Freq: Every day | ORAL | Status: DC
  Administered 2016-04-29 – 2016-04-30 (×2): 10 mg via ORAL
  Filled 2016-04-29 (×2): qty 2

## 2016-04-29 MED ORDER — FENTANYL CITRATE (PF) 100 MCG/2ML IJ SOLN: 25.0000 ug | INTRAMUSCULAR | Status: DC | PRN

## 2016-04-29 MED ORDER — EPHEDRINE 5 MG/ML INJ
INTRAVENOUS | Status: AC
  Filled 2016-04-29: qty 10

## 2016-04-29 MED ORDER — PHENOL 1.4 % MT LIQD: 1.0000 | OROMUCOSAL | Status: DC | PRN

## 2016-04-29 MED ORDER — FENTANYL CITRATE (PF) 100 MCG/2ML IJ SOLN
INTRAMUSCULAR | Status: AC
  Administered 2016-04-29: 50 ug via INTRAVENOUS
  Filled 2016-04-29: qty 2

## 2016-04-29 MED ORDER — STERILE WATER FOR IRRIGATION IR SOLN
Status: DC | PRN
  Administered 2016-04-29: 2000 mL

## 2016-04-29 MED ORDER — TRANEXAMIC ACID 1000 MG/10ML IV SOLN
INTRAVENOUS | Status: DC | PRN
  Administered 2016-04-29: 2000 mg via TOPICAL

## 2016-04-29 MED ORDER — VANCOMYCIN HCL 1000 MG IV SOLR: 1000.0000 mg | Freq: Once | INTRAVENOUS | Status: DC

## 2016-04-29 MED ORDER — SUGAMMADEX SODIUM 200 MG/2ML IV SOLN
INTRAVENOUS | Status: DC | PRN
  Administered 2016-04-29: 200 mg via INTRAVENOUS

## 2016-04-29 MED ORDER — 0.9 % SODIUM CHLORIDE (POUR BTL) OPTIME
TOPICAL | Status: DC | PRN
  Administered 2016-04-29: 1000 mL

## 2016-04-29 MED ORDER — ACETAMINOPHEN 10 MG/ML IV SOLN
INTRAVENOUS | Status: AC
  Filled 2016-04-29: qty 100

## 2016-04-29 MED ORDER — METOCLOPRAMIDE HCL 5 MG PO TABS: 5.0000 mg | ORAL_TABLET | Freq: Three times a day (TID) | ORAL | Status: DC | PRN

## 2016-04-29 MED ORDER — ONDANSETRON HCL 4 MG/2ML IJ SOLN
INTRAMUSCULAR | Status: AC
  Filled 2016-04-29: qty 2

## 2016-04-29 MED ORDER — DEXAMETHASONE SODIUM PHOSPHATE 10 MG/ML IJ SOLN
10.0000 mg | Freq: Once | INTRAMUSCULAR | Status: AC
  Administered 2016-04-29: 10 mg via INTRAVENOUS

## 2016-04-29 MED ORDER — FLUOXETINE HCL 20 MG PO CAPS
40.0000 mg | ORAL_CAPSULE | Freq: Every day | ORAL | Status: DC
  Administered 2016-04-30 – 2016-05-01 (×2): 40 mg via ORAL
  Filled 2016-04-29 (×2): qty 2

## 2016-04-29 MED ORDER — PROPOFOL 10 MG/ML IV BOLUS
INTRAVENOUS | Status: AC
  Filled 2016-04-29: qty 20

## 2016-04-29 MED ORDER — OXYCODONE HCL ER 10 MG PO T12A
30.0000 mg | EXTENDED_RELEASE_TABLET | Freq: Every day | ORAL | Status: DC
  Administered 2016-04-30 – 2016-05-01 (×2): 30 mg via ORAL
  Filled 2016-04-29 (×2): qty 3

## 2016-04-29 MED ORDER — OXYCODONE HCL ER 20 MG PO T12A: 40.0000 mg | EXTENDED_RELEASE_TABLET | Freq: Two times a day (BID) | ORAL | Status: DC

## 2016-04-29 MED ORDER — ACETAMINOPHEN 10 MG/ML IV SOLN
1000.0000 mg | Freq: Once | INTRAVENOUS | Status: AC
  Administered 2016-04-29: 1000 mg via INTRAVENOUS

## 2016-04-29 MED ORDER — FENTANYL CITRATE (PF) 100 MCG/2ML IJ SOLN
25.0000 ug | INTRAMUSCULAR | Status: DC | PRN
  Administered 2016-04-29 (×4): 50 ug via INTRAVENOUS

## 2016-04-29 MED ORDER — POLYETHYLENE GLYCOL 3350 17 G PO PACK: 17.0000 g | PACK | Freq: Every day | ORAL | Status: DC | PRN

## 2016-04-29 MED ORDER — METHOCARBAMOL 500 MG PO TABS
500.0000 mg | ORAL_TABLET | Freq: Four times a day (QID) | ORAL | Status: DC | PRN
  Administered 2016-05-01 (×3): 500 mg via ORAL
  Filled 2016-04-29 (×3): qty 1

## 2016-04-29 MED ORDER — LACTATED RINGERS IV SOLN: INTRAVENOUS | Status: DC

## 2016-04-29 MED ORDER — ACETAMINOPHEN 500 MG PO TABS
1000.0000 mg | ORAL_TABLET | Freq: Four times a day (QID) | ORAL | Status: AC
  Administered 2016-04-29 (×3): 1000 mg via ORAL
  Filled 2016-04-29 (×4): qty 2

## 2016-04-29 MED ORDER — FENTANYL CITRATE (PF) 100 MCG/2ML IJ SOLN
INTRAMUSCULAR | Status: DC | PRN
  Administered 2016-04-29 (×5): 50 ug via INTRAVENOUS

## 2016-04-29 MED ORDER — MORPHINE SULFATE (PF) 2 MG/ML IV SOLN
1.0000 mg | INTRAVENOUS | Status: DC | PRN
  Administered 2016-04-29 (×2): 1 mg via INTRAVENOUS
  Filled 2016-04-29 (×2): qty 1

## 2016-04-29 MED ORDER — LACTATED RINGERS IV SOLN
INTRAVENOUS | Status: DC
  Administered 2016-04-29 (×2): via INTRAVENOUS

## 2016-04-29 MED ORDER — LIDOCAINE 2% (20 MG/ML) 5 ML SYRINGE
INTRAMUSCULAR | Status: DC | PRN
  Administered 2016-04-29: 50 mg via INTRAVENOUS

## 2016-04-29 MED ORDER — VANCOMYCIN HCL IN DEXTROSE 1-5 GM/200ML-% IV SOLN
1000.0000 mg | Freq: Once | INTRAVENOUS | Status: AC
  Administered 2016-04-29: 1000 mg via INTRAVENOUS

## 2016-04-29 MED ORDER — BUPIVACAINE HCL (PF) 0.25 % IJ SOLN
INTRAMUSCULAR | Status: DC | PRN
  Administered 2016-04-29: 30 mL

## 2016-04-29 MED ORDER — VANCOMYCIN HCL IN DEXTROSE 1-5 GM/200ML-% IV SOLN
INTRAVENOUS | Status: AC
  Filled 2016-04-29: qty 200

## 2016-04-29 MED ORDER — DEXAMETHASONE SODIUM PHOSPHATE 10 MG/ML IJ SOLN
10.0000 mg | Freq: Once | INTRAMUSCULAR | Status: AC
  Administered 2016-04-30: 10 mg via INTRAVENOUS
  Filled 2016-04-29: qty 1

## 2016-04-29 MED ORDER — ROCURONIUM BROMIDE 10 MG/ML (PF) SYRINGE
PREFILLED_SYRINGE | INTRAVENOUS | Status: DC | PRN
  Administered 2016-04-29: 40 mg via INTRAVENOUS

## 2016-04-29 MED ORDER — PROPOFOL 10 MG/ML IV BOLUS
INTRAVENOUS | Status: DC | PRN
  Administered 2016-04-29: 100 mg via INTRAVENOUS

## 2016-04-29 MED ORDER — TRANEXAMIC ACID 1000 MG/10ML IV SOLN
2000.0000 mg | Freq: Once | INTRAVENOUS | Status: DC
  Filled 2016-04-29 (×2): qty 20

## 2016-04-29 MED ORDER — METOCLOPRAMIDE HCL 5 MG/ML IJ SOLN: 5.0000 mg | Freq: Three times a day (TID) | INTRAMUSCULAR | Status: DC | PRN

## 2016-04-29 MED ORDER — MEPERIDINE HCL 50 MG/ML IJ SOLN: 6.2500 mg | INTRAMUSCULAR | Status: DC | PRN

## 2016-04-29 MED ORDER — MENTHOL 3 MG MT LOZG
1.0000 | LOZENGE | OROMUCOSAL | Status: DC | PRN
Start: 2016-04-29 — End: 2016-05-01

## 2016-04-29 MED ORDER — AMLODIPINE BESYLATE 10 MG PO TABS
10.0000 mg | ORAL_TABLET | Freq: Every day | ORAL | Status: DC
  Administered 2016-04-30 – 2016-05-01 (×2): 10 mg via ORAL
  Filled 2016-04-29 (×2): qty 1

## 2016-04-29 MED ORDER — ONDANSETRON HCL 4 MG/2ML IJ SOLN
INTRAMUSCULAR | Status: DC | PRN
  Administered 2016-04-29: 4 mg via INTRAVENOUS

## 2016-04-29 MED ORDER — SODIUM CHLORIDE 0.9 % IV SOLN
INTRAVENOUS | Status: DC
  Administered 2016-04-29 – 2016-04-30 (×2): via INTRAVENOUS

## 2016-04-29 MED ORDER — ROCURONIUM BROMIDE 10 MG/ML (PF) SYRINGE
PREFILLED_SYRINGE | INTRAVENOUS | Status: AC
  Filled 2016-04-29: qty 10

## 2016-04-29 MED ORDER — CHLORHEXIDINE GLUCONATE 4 % EX LIQD: 60.0000 mL | Freq: Once | CUTANEOUS | Status: DC

## 2016-04-29 MED ORDER — RIVAROXABAN 10 MG PO TABS
10.0000 mg | ORAL_TABLET | Freq: Every day | ORAL | Status: DC
  Administered 2016-04-30 – 2016-05-01 (×2): 10 mg via ORAL
  Filled 2016-04-29 (×2): qty 1

## 2016-04-29 MED ORDER — FENTANYL CITRATE (PF) 250 MCG/5ML IJ SOLN
INTRAMUSCULAR | Status: AC
  Filled 2016-04-29: qty 5

## 2016-04-29 MED ORDER — BUPIVACAINE HCL (PF) 0.25 % IJ SOLN
INTRAMUSCULAR | Status: AC
  Filled 2016-04-29: qty 30

## 2016-04-29 MED ORDER — ACETAMINOPHEN 650 MG RE SUPP: 650.0000 mg | Freq: Four times a day (QID) | RECTAL | Status: DC | PRN

## 2016-04-29 MED ORDER — LIDOCAINE 2% (20 MG/ML) 5 ML SYRINGE
INTRAMUSCULAR | Status: AC
  Filled 2016-04-29: qty 5

## 2016-04-29 MED ORDER — METHOCARBAMOL 1000 MG/10ML IJ SOLN
500.0000 mg | Freq: Four times a day (QID) | INTRAVENOUS | Status: DC | PRN
  Administered 2016-04-29: 500 mg via INTRAVENOUS
  Filled 2016-04-29: qty 550
  Filled 2016-04-29: qty 5

## 2016-04-29 MED ORDER — VANCOMYCIN HCL IN DEXTROSE 1-5 GM/200ML-% IV SOLN
1000.0000 mg | Freq: Two times a day (BID) | INTRAVENOUS | Status: AC
  Administered 2016-04-29: 1000 mg via INTRAVENOUS
  Filled 2016-04-29: qty 200

## 2016-04-29 MED ORDER — DOCUSATE SODIUM 100 MG PO CAPS
100.0000 mg | ORAL_CAPSULE | Freq: Two times a day (BID) | ORAL | Status: DC
  Administered 2016-04-29 – 2016-05-01 (×4): 100 mg via ORAL
  Filled 2016-04-29 (×5): qty 1

## 2016-04-29 SURGICAL SUPPLY — 39 items
BAG DECANTER FOR FLEXI CONT (MISCELLANEOUS) ×3 IMPLANT
BAG SPEC THK2 15X12 ZIP CLS (MISCELLANEOUS) ×1
BAG ZIPLOCK 12X15 (MISCELLANEOUS) ×3 IMPLANT
BLADE SAG 18X100X1.27 (BLADE) ×3 IMPLANT
CAPT HIP TOTAL 2 ×3 IMPLANT
CLOSURE WOUND 1/2 X4 (GAUZE/BANDAGES/DRESSINGS) ×2
CLOTH BEACON ORANGE TIMEOUT ST (SAFETY) ×3 IMPLANT
COVER PERINEAL POST (MISCELLANEOUS) ×3 IMPLANT
DRAPE STERI IOBAN 125X83 (DRAPES) ×3 IMPLANT
DRAPE U-SHAPE 47X51 STRL (DRAPES) ×6 IMPLANT
DRSG ADAPTIC 3X8 NADH LF (GAUZE/BANDAGES/DRESSINGS) ×3 IMPLANT
DRSG MEPILEX BORDER 4X4 (GAUZE/BANDAGES/DRESSINGS) ×3 IMPLANT
DRSG MEPILEX BORDER 4X8 (GAUZE/BANDAGES/DRESSINGS) ×3 IMPLANT
DURAPREP 26ML APPLICATOR (WOUND CARE) ×3 IMPLANT
ELECT REM PT RETURN 9FT ADLT (ELECTROSURGICAL) ×3
ELECTRODE REM PT RTRN 9FT ADLT (ELECTROSURGICAL) ×1 IMPLANT
EVACUATOR 1/8 PVC DRAIN (DRAIN) ×3 IMPLANT
GLOVE BIO SURGEON STRL SZ7.5 (GLOVE) ×3 IMPLANT
GLOVE BIO SURGEON STRL SZ8 (GLOVE) ×6 IMPLANT
GLOVE BIOGEL PI IND STRL 7.5 (GLOVE) ×3 IMPLANT
GLOVE BIOGEL PI IND STRL 8 (GLOVE) ×2 IMPLANT
GLOVE BIOGEL PI INDICATOR 7.5 (GLOVE) ×6
GLOVE BIOGEL PI INDICATOR 8 (GLOVE) ×4
GLOVE ECLIPSE 6.5 STRL STRAW (GLOVE) ×3 IMPLANT
GLOVE INDICATOR 6.5 STRL GRN (GLOVE) ×3 IMPLANT
GLOVE SURG SS PI 7.5 STRL IVOR (GLOVE) ×3 IMPLANT
GOWN STRL REUS W/ TWL XL LVL3 (GOWN DISPOSABLE) ×1 IMPLANT
GOWN STRL REUS W/TWL LRG LVL3 (GOWN DISPOSABLE) ×3 IMPLANT
GOWN STRL REUS W/TWL XL LVL3 (GOWN DISPOSABLE) ×8 IMPLANT
PACK ANTERIOR HIP CUSTOM (KITS) ×3 IMPLANT
STRIP CLOSURE SKIN 1/2X4 (GAUZE/BANDAGES/DRESSINGS) ×4 IMPLANT
SUT ETHIBOND NAB CT1 #1 30IN (SUTURE) ×3 IMPLANT
SUT MNCRL AB 4-0 PS2 18 (SUTURE) ×3 IMPLANT
SUT VIC AB 2-0 CT1 27 (SUTURE) ×6
SUT VIC AB 2-0 CT1 TAPERPNT 27 (SUTURE) ×3 IMPLANT
SUT VLOC 180 0 24IN GS25 (SUTURE) ×3 IMPLANT
SYR 50ML LL SCALE MARK (SYRINGE) ×3 IMPLANT
TRAY FOLEY CATH SILVER 14FR (SET/KITS/TRAYS/PACK) ×3 IMPLANT
YANKAUER SUCT BULB TIP 10FT TU (MISCELLANEOUS) ×3 IMPLANT

## 2016-04-29 NOTE — Anesthesia Postprocedure Evaluation (Signed)
Anesthesia Post Note  Patient: Kathleen Bates  Procedure(s) Performed: Procedure(s) (LRB): LEFT TOTAL HIP ARTHROPLASTY ANTERIOR APPROACH (Left)  Patient location during evaluation: PACU Anesthesia Type: General Level of consciousness: awake and alert Pain management: pain level controlled Vital Signs Assessment: post-procedure vital signs reviewed and stable Respiratory status: spontaneous breathing, nonlabored ventilation, respiratory function stable and patient connected to nasal cannula oxygen Cardiovascular status: blood pressure returned to baseline and stable Postop Assessment: no signs of nausea or vomiting Anesthetic complications: no    Last Vitals:  Vitals:   04/29/16 1018 04/29/16 1115  BP: 140/67 (!) 113/53  Pulse: 93 93  Resp: 18 18  Temp: 36.7 C     Last Pain:  Vitals:   04/29/16 1058  TempSrc:   PainSc: [name]

## 2016-04-29 NOTE — H&P (View-Only) (Signed)
Kathleen Bates DOB: 11-Apr-1934 Married / Language: English / Race: White Female Date of Admission:  04/29/16 CC:  Left hip pain History of Present Illness The patient is a 82 year old female who comes in for a preoperative History and Physical. The patient is scheduled for a left total hip arthroplasty (anterior) to be performed by Dr. Dione Plover. Aluisio, MD at [facility] on 04/29/2016. The patient is a 82 year old female who presented for a follow-up for Follow-up Hip. The patient is being followed for their left hip pain and osteoarthritis. They are now month(s) out from IA injection. Symptoms reported include: pain, giving way (had a fall 2-3 weeks ago) and difficulty ambulating. The patient feels that they are doing poorly and report their pain level to be 6 / 10. Current treatment includes: activity modification and pain medications. The following medication has been used for pain control: Oxycodone. [name] has had the I-A several months ago and was last seen in Feb this year. She is now at a point where she is afraid not to do the surgery. Her hip has gotten a lot worse since we saw her in February. She is feeling the pain is worse and her function is worsening also. She is afraid she is going to fall because the leg may give out on her. She is at stage where she is now ready to proceed with surgery. They have been treated conservatively in the past for the above stated problem and despite conservative measures, they continue to have progressive pain and severe functional limitations and dysfunction. They have failed non-operative management including home exercise, medications, and injections. It is felt that they would benefit from undergoing total joint replacement. Risks and benefits of the procedure have been discussed with the patient and they elect to proceed with surgery. There are no active contraindications to surgery such as ongoing infection or rapidly progressive neurological  disease.    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 Sulfa 10 *OPHTHALMIC AGENTS*  Rash, Swelling. Furacin *DERMATOLOGICALS*  Rash, Swelling. Allergies Reconciled  Nickel  She has bilateral total knees  Family History Cancer  father Cerebrovascular Accident  mother Depression  sister Diabetes Mellitus  brother Drug / Alcohol Addiction  child Heart disease in female family member before age 17  Hypertension  mother, brother and grandfather mothers side Osteoarthritis  grandmother fathers side Severe allergy  mother  Social History Alcohol use  current drinker; drinks wine; only occasionally per week Children  4 Current work status  retired Engineer, agricultural (Currently)  no Drug/Alcohol Rehab (Previously)  no Exercise  does running / walking Illicit drug use  no Living situation  live with spouse Marital status  married Number of flights of stairs before winded  less than 1 Pain Contract  yes Tobacco / smoke exposure  no Tobacco use  Former smoker. former  smoker; smoke(d) 1 1/2 pack(s) per day  Medication History OxyCODONE HCl (Oral) Specific strength unknown - Active. Benazepril HCl (40MG  Tablet, Oral) Active. Amlodipine Besy-Benazepril HCl (5-40MG   Capsule, Oral) Active. Pepcid (Oral) Specific strength unknown - Active. ALPRAZolam (0.25MG  Tablet, Oral) Active. OxyCONTIN (80MG  Tablet ER 12HR, Oral) Active. FLUoxetine HCl (40MG  Capsule, Oral) Active.  Past Surgical History  Low Back Disc Surgery  Ankle Surgery  bilateral Appendectomy  Arthroscopy of Knee  bilateral Breast Biopsy  left Breast Mass; Local Excision  left Carpal Tunnel Repair  bilateral Dilation and Curettage of Uterus - Multiple  Foot Surgery  bilateral Gallbladder Surgery  laporoscopic Hysterectomy  partial (cancerous) Rotator Cuff Repair  right Spinal Decompression  lower back Spinal Surgery  Straighten Nasal Septum  Tonsillectomy  Total Knee Replacement  bilateral Tubal Ligation    Review of Systems  General Present- Fatigue and Night Sweats. Not Present- Chills, Fever, Memory Loss, Weight Gain and Weight Loss. Skin Not Present- Eczema, Hives, Itching, Lesions and Rash. HEENT Present- Headache, Hearing Loss and Tinnitus. Not Present- Dentures, Double Vision and Visual Loss. Respiratory Present- Cough, Shortness of breath with exertion and Wheezing. Not Present- Allergies, Chronic Cough, Coughing up blood and Shortness of breath at rest. Cardiovascular Not Present- Chest Pain, Difficulty Breathing Lying Down, Murmur, Palpitations, Racing/skipping heartbeats and Swelling. Gastrointestinal Present- Abdominal Pain (at times). Not Present- Bloody Stool, Constipation, Diarrhea, Difficulty Swallowing, Heartburn, Jaundice, Loss of appetitie, Nausea and Vomiting. Female Genitourinary Present- Urinary frequency, Urinary Retention, Urinating at Night and Weak urinary stream. Not Present- Blood in Urine, Discharge, Flank Pain, Incontinence, Painful Urination and Urgency. Musculoskeletal Present- Back Pain, Joint Pain, Morning Stiffness, Muscle Pain and Spasms. Not Present- Joint Swelling and Muscle Weakness. Neurological Not Present- Blackout  spells, Difficulty with balance, Dizziness, Paralysis, Tremor and Weakness. Psychiatric Present- Insomnia.  Vitals  Weight: 186 lb Height: 68in Body Surface Area: 1.98 m Body Mass Index: 28.28 kg/m  Pulse: 76 (Regular)  BP: 128/82 (Sitting, Left Arm, Standard)  Physical Exam  General Mental Status -Alert, cooperative and good historian. General Appearance-pleasant, Not in acute distress. Orientation-Oriented X3. Build & Nutrition-Well nourished and Well developed.  Head and Neck Head-normocephalic, atraumatic . Neck Global Assessment - supple, no bruit auscultated on the right, no bruit auscultated on the left.  Eye Vision-Wears corrective lenses(readers only). Pupil - Bilateral-Regular and Round. Motion - Bilateral-EOMI.  ENMT Note: upper denture plate   Chest and Lung Exam Auscultation Breath sounds - clear at anterior chest wall and clear at posterior chest wall. Adventitious sounds - No Adventitious sounds.  Cardiovascular Auscultation Rhythm - Regular rate and rhythm. Heart Sounds - S1 WNL and S2 WNL. Murmurs & Other Heart Sounds - Auscultation of the heart reveals - No Murmurs.  Abdomen Palpation/Percussion Tenderness - Abdomen is non-tender to palpation. Rigidity (guarding) - Abdomen is soft. Auscultation Auscultation of the abdomen reveals - Bowel sounds normal.  Female Genitourinary Note: Not done, not pertinent to present illness   Musculoskeletal Note: She is alert and oriented, in no apparent distress. Evaluation of her right hip, normal motion, no discomfort. Left hip, flexion about 100, minimal internal rotation, about 20 external rotation, 20 abduction. She is nontender about the hip. She has a significantly antalgic gait pattern.  RADIOGRAPHS AP pelvis and lateral of the hip shows that she is now essentially bone on bone with subchondral cystic formation. The x-rays have worsened from last visit.   Assessment &  Plan Primary osteoarthritis of left hip (M16.12) Aftercare following left knee  joint replacement surgery (Z47.1, [id number])  Note:Surgical Plans: Left Total Hip Replacement - Anterior Approach  Disposition: Home  PCP: Dr. Governor Specking [facility]  Topical TXA  Anesthesia Issues: None  Signed electronically by Ok Edwards, III PA-C

## 2016-04-29 NOTE — Op Note (Signed)
OPERATIVE REPORT- TOTAL HIP ARTHROPLASTY   PREOPERATIVE DIAGNOSIS: Osteoarthritis of the Left hip.   POSTOPERATIVE DIAGNOSIS: Osteoarthritis of the Left  hip.   PROCEDURE: Left total hip arthroplasty, anterior approach.   SURGEON: Gaynelle Arabian, MD   ASSISTANT: Arlee Muslim, PA-C  ANESTHESIA:  Spinal  ESTIMATED BLOOD LOSS:- 400 ml   DRAINS: Hemovac x1.   COMPLICATIONS: None   CONDITION: PACU - hemodynamically stable.   BRIEF CLINICAL NOTE: Kathleen Bates is a 82 y.o. female who has advanced end-  stage arthritis of their Left  hip with progressively worsening pain and  dysfunction.The patient has failed nonoperative management and presents for  total hip arthroplasty.   PROCEDURE IN DETAIL: After successful administration of spinal  anesthetic, the traction boots for the [facility] bed were placed on both  feet and the patient was placed onto the [facility] bed, boots placed into the leg  holders. The Left hip was then isolated from the perineum with plastic  drapes and prepped and draped in the usual sterile fashion. ASIS and  greater trochanter were marked and a oblique incision was made, starting  at about 1 cm lateral and 2 cm distal to the ASIS and coursing towards  the anterior cortex of the femur. The skin was cut with a 10 blade  through subcutaneous tissue to the level of the fascia overlying the  tensor fascia lata muscle. The fascia was then incised in line with the  incision at the junction of the anterior third and posterior 2/3rd. The  muscle was teased off the fascia and then the interval between the TFL  and the rectus was developed. The Hohmann retractor was then placed at  the top of the femoral neck over the capsule. The vessels overlying the  capsule were cauterized and the fat on top of the capsule was removed.  A Hohmann retractor was then placed anterior underneath the rectus  femoris to give exposure to the entire anterior capsule. A T-shaped   capsulotomy was performed. The edges were tagged and the femoral head  was identified.       Osteophytes are removed off the superior acetabulum.  The femoral neck was then cut in situ with an oscillating saw. Traction  was then applied to the left lower extremity utilizing the [facility]  traction. The femoral head was then removed. Retractors were placed  around the acetabulum and then circumferential removal of the labrum was  performed. Osteophytes were also removed. Reaming starts at 45 mm to  medialize and  Increased in 2 mm increments to 47 mm. We reamed in  approximately 40 degrees of abduction, 20 degrees anteversion. A 48 mm  pinnacle acetabular shell was then impacted in anatomic position under  fluoroscopic guidance with excellent purchase. We did not need to place  any additional dome screws. A 28 mm neutral + 4 marathon liner was then  placed into the acetabular shell.       The femoral lift was then placed along the lateral aspect of the femur  just distal to the vastus ridge. The leg was  externally rotated and capsule  was stripped off the inferior aspect of the femoral neck down to the  level of the lesser trochanter, this was done with electrocautery. The femur was lifted after this was performed. The  leg was then placed in an extended and adducted position essentially delivering the femur. We also removed the capsule superiorly and the piriformis from the piriformis  fossa to gain excellent exposure of the  proximal femur. Rongeur was used to remove some cancellous bone to get  into the lateral portion of the proximal femur for placement of the  initial starter reamer. The starter broaches was placed  the starter broach  and was shown to go down the center of the canal. Broaching  with the  Corail system was then performed starting at size 8, coursing  Up to size 11. A size 11 had excellent torsional and rotational  and axial stability. The trial high offset neck was then  placed  with a 28 + 5 trial head. The hip was then reduced. We confirmed that  the stem was in the canal both on AP and lateral x-rays. It also has excellent sizing. The hip was reduced with outstanding stability through full extension and full external rotation.. AP pelvis was taken and the leg lengths were measured and found to be equal. Hip was then dislocated again and the femoral head and neck removed. The  femoral broach was removed. Size 11 Corail stem with a high offset  neck was then impacted into the femur following native anteversion. Has  excellent purchase in the canal. Excellent torsional and rotational and  axial stability. It is confirmed to be in the canal on AP and lateral  fluoroscopic views. The 28 + 5 ceramic head was placed and the hip  reduced with outstanding stability. Again AP pelvis was taken and it  confirmed that the leg lengths were equal. The wound was then copiously  irrigated with saline solution and the capsule reattached and repaired  with Ethibond suture. 30 ml of .25% Bupivicaine was  injected into the capsule and into the edge of the tensor fascia lata as well as subcutaneous tissue. The fascia overlying the tensor fascia lata was then closed with a running #1 V-Loc. Subcu was closed with interrupted 2-0 Vicryl and subcuticular running 4-0 Monocryl. Incision was cleaned  and dried. Steri-Strips and a bulky sterile dressing applied. Hemovac  drain was hooked to suction and then the patient was awakened and transported to  recovery in stable condition.        Please note that a surgical assistant was a medical necessity for this procedure to perform it in a safe and expeditious manner. Assistant was necessary to provide appropriate retraction of vital neurovascular structures and to prevent femoral fracture and allow for anatomic placement of the prosthesis.  Gaynelle Arabian, M.D.

## 2016-04-29 NOTE — Anesthesia Procedure Notes (Signed)
Procedure Name: Intubation Date/Time: 04/29/2016 7:26 AM Performed by: Dione Booze Pre-anesthesia Checklist: Emergency Drugs available, Suction available, Patient being monitored and Patient identified Patient Re-evaluated:Patient Re-evaluated prior to inductionOxygen Delivery Method: Circle system utilized Preoxygenation: Pre-oxygenation with 100% oxygen Intubation Type: IV induction Ventilation: Mask ventilation without difficulty Laryngoscope Size: Mac and 3 Grade View: Grade I Tube type: Oral Tube size: 7.5 mm Number of attempts: 1 Airway Equipment and Method: Stylet Placement Confirmation: ETT inserted through vocal cords under direct vision,  positive ETCO2 and breath sounds checked- equal and bilateral Secured at: 21 cm Tube secured with: Tape Dental Injury: Teeth and Oropharynx as per pre-operative assessment

## 2016-04-29 NOTE — Transfer of Care (Signed)
Immediate Anesthesia Transfer of Care Note  Patient: Kathleen Bates  Procedure(s) Performed: Procedure(s): LEFT TOTAL HIP ARTHROPLASTY ANTERIOR APPROACH (Left)  Patient Location: PACU  Anesthesia Type:General  Level of Consciousness: awake, alert , oriented and patient cooperative  Airway & Oxygen Therapy: Patient Spontanous Breathing and Patient connected to face mask oxygen  Post-op Assessment: Report given to RN and Post -op Vital signs reviewed and stable  Post vital signs: Reviewed and stable  Last Vitals:  Vitals:   04/29/16 0559  BP: 129/70  Pulse: 69  Resp: 16  Temp: 36.7 C    Last Pain:  Vitals:   04/29/16 0600  TempSrc:   PainSc: 4          Complications: No apparent anesthesia complications

## 2016-04-29 NOTE — Interval H&P Note (Signed)
History and Physical Interval Note:  04/29/2016 7:18 AM  Kathleen Bates  has presented today for surgery, with the diagnosis of LEFT HIP OA  The various methods of treatment have been discussed with the patient and family. After consideration of risks, benefits and other options for treatment, the patient has consented to  Procedure(s): LEFT TOTAL HIP ARTHROPLASTY ANTERIOR APPROACH (Left) as a surgical intervention .  The patient's history has been reviewed, patient examined, no change in status, stable for surgery.  I have reviewed the patient's chart and labs.  Questions were answered to the patient's satisfaction.     Gearlean Alf

## 2016-04-29 NOTE — Anesthesia Preprocedure Evaluation (Addendum)
Anesthesia Evaluation  Patient identified by MRN, date of birth, ID band Patient awake    Reviewed: Allergy & Precautions, NPO status , Patient's Chart, lab work & pertinent test results  Airway Mallampati: I  TM Distance: >3 FB Neck ROM: Full    Dental  (+) Edentulous Upper, Dental Advisory Given   Pulmonary asthma , sleep apnea , former smoker,    breath sounds clear to auscultation       Cardiovascular hypertension, Pt. on medications  Rhythm:Regular Rate:Normal     Neuro/Psych  Headaches, PSYCHIATRIC DISORDERS Anxiety Depression    GI/Hepatic hiatal hernia, GERD  Medicated,  Endo/Other  negative endocrine ROS  Renal/GU Renal disease  negative genitourinary   Musculoskeletal  (+) Arthritis , Osteoarthritis,  Fibromyalgia -  Abdominal   Peds negative pediatric ROS (+)  Hematology negative hematology ROS (+)   Anesthesia Other Findings   Reproductive/Obstetrics negative OB ROS                            Lab Results  Component Value Date   WBC 7.3 04/23/2016   HGB 13.8 04/23/2016   HCT 41.8 04/23/2016   MCV 92.1 04/23/2016   PLT 191 04/23/2016   Lab Results  Component Value Date   CREATININE 0.76 04/23/2016   BUN 16 04/23/2016   NA 139 04/23/2016   K 4.3 04/23/2016   CL 102 04/23/2016   CO2 30 04/23/2016   Lab Results  Component Value Date   INR 0.98 04/23/2016   11/2015 EKG: normal sinus rhythm.  Anesthesia Physical Anesthesia Plan  ASA: II  Anesthesia Plan: General   Post-op Pain Management:    Induction: Intravenous  Airway Management Planned: Oral ETT  Additional Equipment:   Intra-op Plan:   Post-operative Plan: Extubation in OR  Informed Consent: I have reviewed the patients History and Physical, chart, labs and discussed the procedure including the risks, benefits and alternatives for the proposed anesthesia with the patient or authorized representative  who has indicated his/her understanding and acceptance.   Dental advisory given  Plan Discussed with: CRNA  Anesthesia Plan Comments:       Anesthesia Quick Evaluation

## 2016-04-29 NOTE — Progress Notes (Signed)
PT Cancellation Note  Patient Details Name: Kathleen Bates MRN: [id number] DOB: 04/08/1934   Cancelled Treatment:    Reason Eval/Treat Not Completed: Pain limiting ability to participate. Spoke with RN who recommneded PT check back tomorrow for eval.   Weston Anna, MPT Pager: [phone]

## 2016-04-30 ENCOUNTER — Encounter (HOSPITAL_COMMUNITY): Payer: Self-pay | Admitting: Orthopedic Surgery

## 2016-04-30 LAB — URINALYSIS, ROUTINE W REFLEX MICROSCOPIC
Bilirubin Urine: NEGATIVE
GLUCOSE, UA: NEGATIVE mg/dL
HGB URINE DIPSTICK: NEGATIVE
Ketones, ur: NEGATIVE mg/dL
LEUKOCYTES UA: NEGATIVE
Nitrite: NEGATIVE
PH: 7.5 (ref 5.0–8.0)
Protein, ur: NEGATIVE mg/dL
SPECIFIC GRAVITY, URINE: 1.007 (ref 1.005–1.030)

## 2016-04-30 LAB — BASIC METABOLIC PANEL
Anion gap: 6 (ref 5–15)
BUN: 7 mg/dL (ref 6–20)
CO2: 30 mmol/L (ref 22–32)
Calcium: 8.5 mg/dL — ABNORMAL LOW (ref 8.9–10.3)
Chloride: 103 mmol/L (ref 101–111)
Creatinine, Ser: 0.65 mg/dL (ref 0.44–1.00)
GFR calc Af Amer: >60 mL/min (ref >60)
GFR calc non Af Amer: >60 mL/min (ref >60)
Glucose, Bld: 136 mg/dL — ABNORMAL HIGH (ref 65–99)
Potassium: 4.8 mmol/L (ref 3.5–5.1)
Sodium: 139 mmol/L (ref 135–145)

## 2016-04-30 LAB — CBC
HCT: 30.9 % — ABNORMAL LOW (ref 36.0–46.0)
Hemoglobin: 10.2 g/dL — ABNORMAL LOW (ref 12.0–15.0)
MCH: 29.5 pg (ref 26.0–34.0)
MCHC: 33 g/dL (ref 30.0–36.0)
MCV: 89.3 fL (ref 78.0–100.0)
PLATELETS: 159 10*3/uL (ref 150–400)
RBC: 3.46 MIL/uL — ABNORMAL LOW (ref 3.87–5.11)
RDW: 12.2 % (ref 11.5–15.5)
WBC: 11.8 10*3/uL — AB (ref 4.0–10.5)

## 2016-04-30 MED ORDER — POLYETHYLENE GLYCOL 3350 17 G PO PACK
17.0000 g | PACK | Freq: Every day | ORAL | Status: DC
  Administered 2016-04-30 – 2016-05-01 (×2): 17 g via ORAL
  Filled 2016-04-30 (×2): qty 1

## 2016-04-30 MED ORDER — BISACODYL 10 MG RE SUPP
10.0000 mg | Freq: Once | RECTAL | Status: AC
  Administered 2016-04-30: 10 mg via RECTAL
  Filled 2016-04-30: qty 1

## 2016-04-30 MED ORDER — NALOXEGOL OXALATE 12.5 MG PO TABS
12.5000 mg | ORAL_TABLET | Freq: Every day | ORAL | Status: DC
  Administered 2016-04-30 – 2016-05-01 (×2): 12.5 mg via ORAL
  Filled 2016-04-30 (×2): qty 1

## 2016-04-30 MED ORDER — NALOXEGOL OXALATE 12.5 MG PO TABS
12.5000 mg | ORAL_TABLET | Freq: Every day | ORAL | Status: DC
  Filled 2016-04-30: qty 1

## 2016-04-30 NOTE — Evaluation (Addendum)
Physical Therapy Evaluation Patient Details Name: Kathleen Bates MRN: [id number] DOB: 06-Apr-1934 Today's Date: 04/30/2016   History of Present Illness  Kathleen Bates, multiple R knee surgeries, R hip surgeries, Kathleen TKA in past, s/p craniotomy for acoustic neuroma  Clinical Impression  The patient required moderate assistance with bed mobility and ambulation initially. Gait sequence improved. The patient does demonstrate decreased balance at times. She most likely will require 1 person assist for  Mobility after discharge due to balance  . Pt admitted with above diagnosis. Pt currently with functional limitations due to the deficits listed below (see PT Problem List).  Pt will benefit from skilled PT to increase their independence and safety with mobility to allow discharge to the venue listed below.        Follow Up Recommendations Home health PT;Supervision/Assistance - 24 hour    Equipment Recommendations  Rolling walker with 5" wheels (Patient reports that she does not want one. )    Recommendations for Other Services   OT    Precautions / Restrictions Precautions Precautions: Fall      Mobility  Bed Mobility Overal bed mobility: Needs Assistance Bed Mobility: Supine to Sit     Supine to sit: Mod assist;HOB elevated     General bed mobility comments: initially used sheet to move the left leg but then needed arms to self assist the  trunk. Multimodal cues for technique, position the body. Mod assist by PT for the trunk to fully sit up.  Transfers Overall transfer level: Needs assistance Equipment used: Rolling walker (2 wheeled) Transfers: Sit to/from Stand Sit to Stand: Mod assist         General transfer comment: steady assist upon standing, decreased balance noted. Multimodal cues for hand and Left leg position prior to sitting down, Assist to lower to [facility] and to recliner, extra time to sit down.  Ambulation/Gait Ambulation/Gait assistance: Mod assist;+2  safety/equipment Ambulation Distance (Feet): 35 Feet Assistive device: Rolling walker (2 wheeled) Gait Pattern/deviations: Step-to pattern;Shuffle;Decreased stance time - left;Decreased step length - left;Antalgic     General Gait Details: extra time to  initiate steps, multimodal cues for sequqnce. Unable to advance the left leg initially. Improved with  ambulation.   Stairs            Wheelchair Mobility    Modified Rankin (Stroke Patients Only)       Balance Overall balance assessment: History of Falls;Needs assistance Sitting-balance support: Feet supported;Bilateral upper extremity supported Sitting balance-Leahy Scale: Fair     Standing balance support: During functional activity;Bilateral upper extremity supported Standing balance-Leahy Scale: Poor Standing balance comment: initally required steady assist upon standing. Improved with  ambulation and stood from [facility] with less support.                             Pertinent Vitals/Pain Pain Assessment: 0-10 Pain Score: 6  Pain Location: Kathleen hip Pain Descriptors / Indicators: Sore;Burning;Tender    Home Living Family/patient expects to be discharged to:: Private residence Living Arrangements: Spouse/significant other Available Help at Discharge: Family Type of Home: House Home Access: Level entry     Home Layout: One level Home Equipment: Environmental consultant - 4 wheels;Walker - standard      Prior Function Level of Independence: Independent         Comments: patiwnt reports balance losses at times     Hand Dominance        Extremity/Trunk Assessment  Upper Extremity Assessment: Defer to OT evaluation           Lower Extremity Assessment: LLE deficits/detail   LLE Deficits / Details: initially difficulty advancing the left leg, assist with hip/knee flexion in supine  Cervical / Trunk Assessment: Normal  Communication   Communication: No difficulties  Cognition Arousal/Alertness:  Awake/alert Behavior During Therapy: Anxious;WFL for tasks assessed/performed Overall Cognitive Status: Within Functional Limits for tasks assessed                      General Comments      Exercises Total Joint Exercises Ankle Circles/Pumps: AROM;Both;10 reps;Supine Quad Sets: Supine Short Arc Quad: AAROM;Left;10 reps Heel Slides: AAROM;Left;10 reps;Supine Hip ABduction/ADduction: AAROM;Left;10 reps;Supine   Assessment/Plan    PT Assessment Patient needs continued PT services  PT Problem List Decreased strength;Decreased range of motion;Decreased activity tolerance;Decreased balance;Decreased mobility;Decreased coordination;Decreased knowledge of precautions;Decreased safety awareness;Decreased knowledge of use of DME          PT Treatment Interventions DME instruction;Gait training;Functional mobility training;Therapeutic activities;Therapeutic exercise;Patient/family education    PT Goals (Current goals can be found in the Care Plan section)  Acute Rehab PT Goals Patient Stated Goal: to go home PT Goal Formulation: With patient Time For Goal Achievement: 05/04/16 Potential to Achieve Goals: Good    Frequency 7X/week   Barriers to discharge        Co-evaluation               End of Session Equipment Utilized During Treatment: Gait belt Activity Tolerance: Patient limited by fatigue Patient left: in chair;with call bell/phone within reach;with chair alarm set Nurse Communication: Mobility status         Time: [id number] PT Time Calculation (min) (ACUTE ONLY): 33 min   Charges:   PT Evaluation $PT Eval Moderate Complexity: 1 Procedure PT Treatments $Gait Training: 8-22 mins   PT G Codes:        Claretha Cooper 04/30/2016, 12:41 PM Tresa Endo PT [phone]

## 2016-04-30 NOTE — Progress Notes (Signed)
Subjective: 1 Day Post-Op Procedure(s) (LRB): LEFT TOTAL HIP ARTHROPLASTY ANTERIOR APPROACH (Left) Patient reports pain as moderate.   Patient seen in rounds with Dr. Wynelle Link.  Family in room at bedside. Patient is having problems with pain in the hip, requiring pain medications  Also complains of some abdominal discomfort.  No nausea. We will start therapy today. Bowel meds due to chronic opioid use. Plan is to go Home after hospital stay.  Objective: Vital signs in last 24 hours: Temp:  [97.4 F (36.3 C)-98.8 F (37.1 C)] 97.9 F (36.6 C) (09/28 0700) Pulse Rate:  [56-101] 84 (09/28 0700) Resp:  [14-20] 20 (09/28 0700) BP: (107-163)/(53-99) 136/78 (09/28 0700) SpO2:  [94 %-99 %] 99 % (09/28 0700)  Intake/Output from previous day:  Intake/Output Summary (Last 24 hours) at 04/30/16 0822 Last data filed at 04/30/16 0600  Gross per 24 hour  Intake             2785 ml  Output             2940 ml  Net             -155 ml    Intake/Output this shift: No intake/output data recorded.  Labs:  Recent Labs  04/30/16 0442  HGB 10.2*    Recent Labs  04/30/16 0442  WBC 11.8*  RBC 3.46*  HCT 30.9*  PLT 159    Recent Labs  04/30/16 0442  NA 139  K 4.8  CL 103  CO2 30  BUN 7  CREATININE 0.65  GLUCOSE 136*  CALCIUM 8.5*   No results for input(s): LABPT, INR in the last 72 hours.  EXAM General - Patient is Alert, Appropriate and Oriented Extremity - Neurovascular intact Sensation intact distally Dorsiflexion/Plantar flexion intact Dressing - dressing C/D/I Motor Function - intact, moving foot and toes well on exam.  Hemovac pulled without difficulty.  Past Medical History:  Diagnosis Date  . Anemia   . Anxiety   . Asthma   . Balance problem MILD-- POST ACOUSTIC NEUROMA  . Cancer ([place])   . Cataract immature BILATERAL  . Chronic back pain    CHRONIC NARCTIC -- GOES TO PAIN CLINIC  . Chronic cystitis   . Chronic kidney disease    polynephritis/freq.  uti  . Constipated   . Depression   . Dry mouth    POST ACOUSTIC NEUROMA  . Fibromyalgia   . Frequency of urination   . GERD (gastroesophageal reflux disease)   . Headache   . Hemorrhoids   . History of cervical cancer S/P VAG. HYSTECTOMY AGE 53  . Hypercholesteremia   . Hypertension   . IBS (irritable bowel syndrome)   . 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 Incontinence of urine   . Infection of ear 03/31/2016   staph in left ear  . Insomnia   . Interstitial cystitis   . Lumbar stenosis   . Macular degeneration of both eyes   . Nocturia   . Ringing in ear    right  . SOB (shortness of breath) on exertion   . Status post excision of acoustic neuroma 1988   RESIDUAL--  RIGHT HEARING LOSS, MILD IMPAIRED BALANCE, AND DRY MOUTH  . Urgency of urination     Assessment/Plan: 1 Day Post-Op Procedure(s) (LRB): LEFT TOTAL HIP ARTHROPLASTY ANTERIOR APPROACH (Left) Principal Problem:   OA (osteoarthritis) of hip  Estimated body mass index is 28.59 kg/m as calculated from the  following:   Height as of this encounter: 5\' 8"  (1.727 m).   Weight as of this encounter: 85.3 kg (188 lb). Advance diet Up with therapy Discharge home with home health when doing well  DVT Prophylaxis - Xarelto Weight Bearing As Tolerated left Leg Hemovac Pulled Begin Therapy Dulcolax Supp Miralax Movantik  Arlee Muslim, PA-C Orthopaedic Surgery 04/30/2016, 8:22 AM

## 2016-04-30 NOTE — Evaluation (Signed)
Occupational Therapy Evaluation Patient Details Name: Kathleen Bates MRN: [id number] DOB: November 18, 1933 Today's Date: 04/30/2016    History of Present Illness L DATHA, multiple R knee surgeries, R hip surgeries, L TKA in past, s/p craniotomy for acoustic neuroma   Clinical Impression   This 81 year old female was admitted for the above sx. She will benefit from continued OT to increase safety and independence with bathroom transfers/ADLs. Goals are for min guard overall.  Pt currently needs min A for transfers    Follow Up Recommendations  Supervision/Assistance - 24 hour    Equipment Recommendations  None recommended by OT (pt states she has a 3:1 commode)    Recommendations for Other Services       Precautions / Restrictions Precautions Precautions: Fall Restrictions Weight Bearing Restrictions: No      Mobility Bed Mobility            General bed mobility comments: mod A for sit to supine:  assist for bil LEs  Transfers Overall transfer level: Needs assistance Equipment used: Rolling walker (2 wheeled)           General transfer comment: pt was standing in bathroom when OT arrived    Balance Overall balance assessment: History of Falls;Needs assistance Sitting-balance support: Feet supported;Bilateral upper extremity supported Sitting balance-Leahy Scale: Fair     Standing balance support: During functional activity;Bilateral upper extremity supported Standing balance-Leahy Scale: Poor                             ADL Overall ADL's : Needs assistance/impaired     Grooming: Min guard;Standing   Upper Body Bathing: Set up;Sitting   Lower Body Bathing: Moderate assistance;Sit to/from stand   Upper Body Dressing : Set up;Sitting   Lower Body Dressing: Maximal assistance;Sit to/from stand   Toilet Transfer: Minimal assistance;Ambulation;BSC;RW   Toileting- Clothing Manipulation and Hygiene: Minimal assistance;Sit to/from stand         General ADL Comments: pt was in the bathroom when I arrived.  She was standing and performing hygiene.  Pt had 2 LOB while I was there.  Cued to call for assistance prior to standing.  Pt states her husband will assist with adls at home.  She also has a Advertising account executive      Pertinent Vitals/Pain Pain Assessment: 0-10 Pain Score: 6  Pain Location: L hip Pain Descriptors / Indicators: Sore     Hand Dominance     Extremity/Trunk Assessment Upper Extremity Assessment Upper Extremity Assessment: Overall WFL for tasks assessed      Cervical / Trunk Assessment Cervical / Trunk Assessment: Normal   Communication Communication Communication: No difficulties   Cognition Arousal/Alertness: Awake/alert Behavior During Therapy: WFL for tasks assessed/performed Overall Cognitive Status: Within Functional Limits for tasks assessed (decreased safety:  cues needed)                     General Comments       Exercises       Shoulder Instructions      Home Living Family/patient expects to be discharged to:: Private residence Living Arrangements: Spouse/significant other Available Help at Discharge: Family Type of Home: House Home Access: Level entry     Home Layout: One level     Bathroom Shower/Tub: Occupational psychologist: Handicapped height  Home Equipment: [place] - 4 wheels;Walker - standard;Bedside commode;Shower seat          Prior Functioning/Environment Level of Independence: Independent        Comments: LOB at times        OT Problem List: Decreased strength;Decreased activity tolerance;Impaired balance (sitting and/or standing);Decreased knowledge of use of DME or AE;Pain;Decreased knowledge of precautions   OT Treatment/Interventions: Self-care/ADL training;DME and/or AE instruction;Balance training;Patient/family education    OT Goals(Current goals can be found in the care plan section) Acute  Rehab OT Goals Patient Stated Goal: to go home OT Goal Formulation: With patient Time For Goal Achievement: 05/07/16 Potential to Achieve Goals: Good ADL Goals Pt Will Transfer to Toilet: with min guard assist;ambulating;bedside commode Pt Will Perform Toileting - Clothing Manipulation and hygiene: with min guard assist;sit to/from stand Pt Will Perform Tub/Shower Transfer: Shower transfer;ambulating;3 in 1;with min guard assist  OT Frequency: Min 2X/week   Barriers to D/C:            Co-evaluation              End of Session    Activity Tolerance: Patient tolerated treatment well Patient left: in bed;with call bell/phone within reach;with bed alarm set   Time: 1254-1303 OT Time Calculation (min): 9 min Charges:  OT General Charges $OT Visit: 1 Procedure OT Evaluation $OT Eval Low Complexity: 1 Procedure G-Codes:    Kamare Caspers 2016/05/10, 2:06 PM  Lesle Chris, OTR/L [phone] May 10, 2016

## 2016-04-30 NOTE — Progress Notes (Signed)
Physical Therapy Treatment Patient Details Name: Kathleen Bates MRN: [id number] DOB: Apr 20, 1934 Today's Date: 04/30/2016    History of Present Illness L DATHA, multiple R knee surgeries, R hip surgeries, L TKA in past, s/p craniotomy for acoustic neuroma    PT Comments    Pt ambulated in hallway and was assisted back to bed. Pt educated on using RW vs. rollator so that she is able to ambulate with increased stability and improved gait pattern and posture.  Follow Up Recommendations  Home health PT;Supervision/Assistance - 24 hour     Equipment Recommendations  Rolling walker with 5" wheels    Recommendations for Other Services       Precautions / Restrictions Precautions Precautions: Fall Restrictions Weight Bearing Restrictions: No    Mobility  Bed Mobility Overal bed mobility: Needs Assistance Bed Mobility: (P) Supine to Sit;Sit to Supine     Supine to sit: HOB elevated;Min guard     General bed mobility comments: guarding for safety; verbal cues for safe, controlled technique   Transfers Overall transfer level: Needs assistance Equipment used: Rolling walker (2 wheeled) Transfers: Sit to/from Stand Sit to Stand: Min assist         General transfer comment: assistance for steadying upon rise and controlled descent; verbal cues for UE positioning and to L LE positioning; assistance for raise and lower from bed and [facility]  Ambulation/Gait Ambulation/Gait assistance: Mod assist Ambulation Distance (Feet): 50 Feet Assistive device: 4-wheeled walker (recommended RW to pt for safety and balance) Gait Pattern/deviations: Decreased stride length;Decreased stance time - left;Decreased step length - left;Antalgic     General Gait Details: assistance for steadying and safety; increased time to initiate steps leading with L leg; multimodal cues for UE and RW positioning and to maintain upright trunk posture; pt demonstrated external rotation and drag of L LE during  gait   Stairs            Wheelchair Mobility    Modified Rankin (Stroke Patients Only)       Balance Overall balance assessment: History of Falls;Needs assistance Sitting-balance support: Feet supported;Bilateral upper extremity supported Sitting balance-Leahy Scale: Fair     Standing balance support: During functional activity;Bilateral upper extremity supported Standing balance-Leahy Scale: Poor Standing balance comment: pt demonstrated poor balance using rollator to ambulate (no LOB episodes, however required assist from student PT for steadying throughout ambulation); recommended that pt use RW until balance and safety issues resolve                    Cognition Arousal/Alertness: Awake/alert Behavior During Therapy: WFL for tasks assessed/performed Overall Cognitive Status: Within Functional Limits for tasks assessed                      Exercises Total Joint Exercises Ankle Circles/Pumps: AROM;Both;10 reps;Supine Quad Sets: Supine Short Arc Quad: AAROM;Left;10 reps Heel Slides: AAROM;Left;10 reps;Supine Hip ABduction/ADduction: AAROM;Left;10 reps;Supine    General Comments        Pertinent Vitals/Pain Pain Assessment: 0-10 Pain Score: 6  Pain Location: L hip Pain Descriptors / Indicators: Aching;Discomfort;Sore Pain Intervention(s): Limited activity within patient's tolerance;Monitored during session;Premedicated before session;Repositioned;Ice applied    Home Living Family/patient expects to be discharged to:: Private residence Living Arrangements: Spouse/significant other Available Help at Discharge: Family Type of Home: House Home Access: Level entry   Home Layout: [place]: Environmental consultant - 4 wheels;Walker - standard;Bedside commode;Shower seat      Prior Function Level of  Independence: Independent      Comments: LOB at times   PT Goals (current goals can now be found in the care plan section) Acute Rehab PT  Goals Patient Stated Goal: to go home PT Goal Formulation: With patient Time For Goal Achievement: 05/04/16 Potential to Achieve Goals: Good Progress towards PT goals: Progressing toward goals (slow rate of progression)    Frequency    7X/week      PT Plan Current plan remains appropriate    Co-evaluation             End of Session Equipment Utilized During Treatment: Gait belt Activity Tolerance: Patient limited by fatigue Patient left: in bed;with call bell/phone within reach;with bed alarm set     Time: [id number] PT Time Calculation (min) (ACUTE ONLY): 30 min  Charges:  $Gait Training: 8-22 mins                    G Codes:      Dewitt Hoes 2016-05-14, 4:29 PM Dewitt Hoes, SPT

## 2016-04-30 NOTE — Care Management Note (Signed)
Case Management Note  Patient Details  Name: Kathleen Bates MRN: [id number] Date of Birth: 1934/02/07  Subjective/Objective:                  LEFT TOTAL HIP ARTHROPLASTY ANTERIOR APPROACH (Left) Action/Plan: Discharge planning Expected Discharge Date:                  Expected Discharge Plan:  [place]  In-House Referral:     Discharge planning Services  CM Consult  Post Acute Care Choice:  Home Health Choice offered to:  Patient  DME Arranged:  N/A DME Agency:  NA  HH Arranged:  PT [place] Agency:  [facility] ([facility]  Status of Service:  Completed, signed off  If discussed at [employer], dates discussed:    Additional Comments: CM met with pt in room to offer choice of home health agency. Pt chooses [facility] to render HHPT.  Referral given to [facility] rep, Tim.  Pt states she has both rolling walker and elevated safety commode  at home and denies need for additional DME.  No other CM needs were communicated. [name], RN 04/30/2016, 11:56 AM

## 2016-05-01 LAB — BASIC METABOLIC PANEL
ANION GAP: 7 (ref 5–15)
BUN: 12 mg/dL (ref 6–20)
CALCIUM: 9.2 mg/dL (ref 8.9–10.3)
CHLORIDE: 102 mmol/L (ref 101–111)
CO2: 32 mmol/L (ref 22–32)
Creatinine, Ser: 0.53 mg/dL (ref 0.44–1.00)
GFR calc non Af Amer: >60 mL/min (ref >60)
GLUCOSE: 117 mg/dL — AB (ref 65–99)
Potassium: 4.1 mmol/L (ref 3.5–5.1)
Sodium: 141 mmol/L (ref 135–145)

## 2016-05-01 LAB — CBC
HEMATOCRIT: 34.4 % — AB (ref 36.0–46.0)
HEMOGLOBIN: 11.2 g/dL — AB (ref 12.0–15.0)
MCH: 30 pg (ref 26.0–34.0)
MCHC: 32.6 g/dL (ref 30.0–36.0)
MCV: 92.2 fL (ref 78.0–100.0)
Platelets: 186 10*3/uL (ref 150–400)
RBC: 3.73 MIL/uL — ABNORMAL LOW (ref 3.87–5.11)
RDW: 12.6 % (ref 11.5–15.5)
WBC: 12.7 10*3/uL — AB (ref 4.0–10.5)

## 2016-05-01 LAB — URINE CULTURE: Culture: NO GROWTH

## 2016-05-01 MED ORDER — METHOCARBAMOL 500 MG PO TABS: 500.0000 mg | ORAL_TABLET | Freq: Four times a day (QID) | ORAL | 0 refills | Status: AC | PRN

## 2016-05-01 MED ORDER — NALOXEGOL OXALATE 12.5 MG PO TABS: 12.5000 mg | ORAL_TABLET | Freq: Every day | ORAL | 0 refills | Status: AC

## 2016-05-01 MED ORDER — OXYCODONE HCL 10 MG PO TABS: 10.0000 mg | ORAL_TABLET | ORAL | 0 refills | Status: AC | PRN

## 2016-05-01 MED ORDER — RIVAROXABAN 10 MG PO TABS: 10.0000 mg | ORAL_TABLET | Freq: Every day | ORAL | 0 refills | Status: AC

## 2016-05-01 MED ORDER — ONDANSETRON HCL 4 MG PO TABS: 4.0000 mg | ORAL_TABLET | Freq: Four times a day (QID) | ORAL | 0 refills | Status: AC | PRN

## 2016-05-01 NOTE — Discharge Instructions (Signed)
° °Dr. Frank Aluisio °Total Joint Specialist °[facility] °[address] °[place], [place] [address] °[phone] ° °ANTERIOR APPROACH TOTAL HIP REPLACEMENT POSTOPERATIVE DIRECTIONS ° ° °Hip Rehabilitation, Guidelines Following Surgery  °The results of a hip operation are greatly improved after range of motion and muscle strengthening exercises. Follow all safety measures which are given to protect your hip. If any of these exercises cause increased pain or swelling in your joint, decrease the amount until you are comfortable again. Then slowly increase the exercises. Call your caregiver if you have problems or questions.  ° °HOME CARE INSTRUCTIONS  °Remove items at home which could result in a fall. This includes throw rugs or furniture in walking pathways.  °· ICE to the affected hip every three hours for 30 minutes at a time and then as needed for pain and swelling.  Continue to use ice on the hip for pain and swelling from surgery. You may notice swelling that will progress down to the foot and ankle.  This is normal after surgery.  Elevate the leg when you are not up walking on it.   °· Continue to use the breathing machine which will help keep your temperature down.  It is common for your temperature to cycle up and down following surgery, especially at night when you are not up moving around and exerting yourself.  The breathing machine keeps your lungs expanded and your temperature down. ° ° °DIET °You may resume your previous home diet once your are discharged from the hospital. ° °DRESSING / WOUND CARE / SHOWERING °You may shower 3 days after surgery, but keep the wounds dry during showering.  You may use an occlusive plastic wrap (Press'n Seal for example), NO SOAKING/SUBMERGING IN THE BATHTUB.  If the bandage gets wet, change with a clean dry gauze.  If the incision gets wet, pat the wound dry with a clean towel. °You may start showering once you are discharged home but do not  submerge the incision under water. Just pat the incision dry and apply a dry gauze dressing on daily. °Change the surgical dressing daily and reapply a dry dressing each time. ° °ACTIVITY °Walk with your walker as instructed. °Use walker as long as suggested by your caregivers. °Avoid periods of inactivity such as sitting longer than an hour when not asleep. This helps prevent blood clots.  °You may resume a sexual relationship in one month or when given the OK by your doctor.  °You may return to work once you are cleared by your doctor.  °Do not drive a car for 6 weeks or until released by you surgeon.  °Do not drive while taking narcotics. ° °WEIGHT BEARING °Weight bearing as tolerated with assist device (walker, cane, etc) as directed, use it as long as suggested by your surgeon or therapist, typically at least 4-6 weeks. ° °POSTOPERATIVE CONSTIPATION PROTOCOL °Constipation - defined medically as fewer than three stools per week and severe constipation as less than one stool per week. ° °One of the most common issues patients have following surgery is constipation.  Even if you have a regular bowel pattern at home, your normal regimen is likely to be disrupted due to multiple reasons following surgery.  Combination of anesthesia, postoperative narcotics, change in appetite and fluid intake all can affect your bowels.  In order to avoid complications following surgery, here are some recommendations in order to help you during your recovery period. ° °Colace (docusate) - Pick up an over-the-counter   form of Colace or another stool softener and take twice a day as long as you are requiring postoperative pain medications.  Take with a full glass of water daily.  If you experience loose stools or diarrhea, hold the colace until you stool forms back up.  If your symptoms do not get better within 1 week or if they get worse, check with your doctor. ° °Dulcolax (bisacodyl) - Pick up over-the-counter and take as directed  by the product packaging as needed to assist with the movement of your bowels.  Take with a full glass of water.  Use this product as needed if not relieved by Colace only.  ° °MiraLax (polyethylene glycol) - Pick up over-the-counter to have on hand.  MiraLax is a solution that will increase the amount of water in your bowels to assist with bowel movements.  Take as directed and can mix with a glass of water, juice, soda, coffee, or tea.  Take if you go more than two days without a movement. °Do not use MiraLax more than once per day. Call your doctor if you are still constipated or irregular after using this medication for 7 days in a row. ° °If you continue to have problems with postoperative constipation, please contact the office for further assistance and recommendations.  If you experience "the worst abdominal pain ever" or develop nausea or vomiting, please contact the office immediatly for further recommendations for treatment. ° °ITCHING ° If you experience itching with your medications, try taking only a single pain pill, or even half a pain pill at a time.  You can also use Benadryl over the counter for itching or also to help with sleep.  ° °TED HOSE STOCKINGS °Wear the elastic stockings on both legs for three weeks following surgery during the day but you may remove then at night for sleeping. ° °MEDICATIONS °See your medication summary on the “After Visit Summary” that the nursing staff will review with you prior to discharge.  You may have some home medications which will be placed on hold until you complete the course of blood thinner medication.  It is important for you to complete the blood thinner medication as prescribed by your surgeon.  Continue your approved medications as instructed at time of discharge. ° °PRECAUTIONS °If you experience chest pain or shortness of breath - call 911 immediately for transfer to the hospital emergency department.  °If you develop a fever greater that 101 F,  purulent drainage from wound, increased redness or drainage from wound, foul odor from the wound/dressing, or calf pain - CONTACT YOUR SURGEON.   °                                                °FOLLOW-UP APPOINTMENTS °Make sure you keep all of your appointments after your operation with your surgeon and caregivers. You should call the office at the above phone number and make an appointment for approximately two weeks after the date of your surgery or on the date instructed by your surgeon outlined in the "After Visit Summary". ° °RANGE OF MOTION AND STRENGTHENING EXERCISES  °These exercises are designed to help you keep full movement of your hip joint. Follow your caregiver's or physical therapist's instructions. Perform all exercises about fifteen times, three times per day or as directed. Exercise both hips, even if you   have had only one joint replacement. These exercises can be done on a training (exercise) mat, on the floor, on a table or on a bed. Use whatever works the best and is most comfortable for you. Use music or television while you are exercising so that the exercises are a pleasant break in your day. This will make your life better with the exercises acting as a break in routine you can look forward to.  Lying on your back, slowly slide your foot toward your buttocks, raising your knee up off the floor. Then slowly slide your foot back down until your leg is straight again.  Lying on your back spread your legs as far apart as you can without causing discomfort.  Lying on your side, raise your upper leg and foot straight up from the floor as far as is comfortable. Slowly lower the leg and repeat.  Lying on your back, tighten up the muscle in the front of your thigh (quadriceps muscles). You can do this by keeping your leg straight and trying to raise your heel off the floor. This helps strengthen the largest muscle supporting your knee.  Lying on your back, tighten up the muscles of your  buttocks both with the legs straight and with the knee bent at a comfortable angle while keeping your heel on the floor.   IF YOU ARE TRANSFERRED TO A SKILLED REHAB FACILITY If the patient is transferred to a skilled rehab facility following release from the hospital, a list of the current medications will be sent to the facility for the patient to continue.  When discharged from the skilled rehab facility, please have the facility set up the patient's [place] prior to being released. Also, the skilled facility will be responsible for providing the patient with their medications at time of release from the facility to include their pain medication, the muscle relaxants, and their blood thinner medication. If the patient is still at the rehab facility at time of the two week follow up appointment, the skilled rehab facility will also need to assist the patient in arranging follow up appointment in our office and any transportation needs.  MAKE SURE YOU:  Understand these instructions.  Get help right away if you are not doing well or get worse.    Pick up stool softner and laxative for home use following surgery while on pain medications. Do not submerge incision under water. Please use good hand washing techniques while changing dressing each day. May shower starting three days after surgery. Please use a clean towel to pat the incision dry following showers. Continue to use ice for pain and swelling after surgery. Do not use any lotions or creams on the incision until instructed by your surgeon.  Take Xarelto for two and a half more weeks, then discontinue Xarelto. Once the patient has completed the blood thinner regimen, then take a Baby 81 mg Aspirin daily for three more weeks.   Information on my medicine - XARELTO (Rivaroxaban)  This medication education was reviewed with me or my healthcare representative as part of my discharge preparation.  The pharmacist that  spoke with me during my hospital stay was:  Minda Ditto, [facility]  Why was Xarelto prescribed for you? Xarelto was prescribed for you to reduce the risk of blood clots forming after orthopedic surgery. The medical term for these abnormal blood clots is venous thromboembolism (VTE).  What do you need to know about xarelto ? Take your Xarelto  DAILY at the same time every day. °You may take it either with or without food. ° °If you have difficulty swallowing the tablet whole, you may crush it and mix in applesauce just prior to taking your dose. ° °Take Xarelto® exactly as prescribed by your doctor and DO NOT stop taking Xarelto® without talking to the doctor who prescribed the medication.  Stopping without other VTE prevention medication to take the place of Xarelto® may increase your risk of developing a clot. ° °After discharge, you should have regular check-up appointments with your healthcare provider that is prescribing your Xarelto®.   ° °What do you do if you miss a dose? °If you miss a dose, take it as soon as you remember on the same day then continue your regularly scheduled once daily regimen the next day. Do not take two doses of Xarelto® on the same day.  ° °Important Safety Information °A possible side effect of Xarelto® is bleeding. You should call your healthcare provider right away if you experience any of the following: °? Bleeding from an injury or your nose that does not stop. °? Unusual colored urine (red or dark brown) or unusual colored stools (red or black). °? Unusual bruising for unknown reasons. °? A serious fall or if you hit your head (even if there is no bleeding). ° °Some medicines may interact with Xarelto® and might increase your risk of bleeding while on Xarelto®. To help avoid this, consult your healthcare provider or pharmacist prior to using any new prescription or non-prescription medications, including herbals, vitamins, non-steroidal anti-inflammatory drugs (NSAIDs)  and supplements. ° °This website has more information on Xarelto®: www.xarelto.com. ° ° ° °

## 2016-05-01 NOTE — Progress Notes (Signed)
Occupational Therapy Treatment Patient Details Name: Kathleen Bates MRN: [id number] DOB: 06/11/1934 Today's Date: 05/01/2016    History of present illness L DATHA, multiple R knee surgeries, R hip surgeries, L TKA in past, s/p craniotomy for acoustic neuroma   OT comments  Pt needs reinforcement with safety  Follow Up Recommendations  Supervision/Assistance - 24 hour    Equipment Recommendations  None recommended by OT    Recommendations for Other Services      Precautions / Restrictions Precautions Precautions: Fall Restrictions Other Position/Activity Restrictions: WBAT       Mobility Bed Mobility           Sit to supine: Min assist   General bed mobility comments: assist for LLE into bed  Transfers   Equipment used: Rolling walker (2 wheeled)   Sit to Stand: Min guard         General transfer comment: for safety    Balance                                   ADL                                   Tub/ Shower Transfer: Minimal assistance;Walk-in shower;Ambulation     General ADL Comments: Pt had just returned from using bathroom.  Pt stood without walker; cued to sit and wait for this.  ambulated to bathroom with min guard.  Min A to help LLE over shower ledge on way out.  Pt gets up frequently to urinate due to interstitial cystitis.  She has a BSC she can use at night.  Reinforced needs for safety with RW and help prior to transfer      [place] During Therapy: [facility] for tasks assessed/performed Overall Cognitive Status:  (decreased safety awareness)                       Extremity/Trunk Assessment               Exercises     Shoulder Instructions       General Comments      Pertinent Vitals/ Pain       Pain Score: 4  Pain Location: L hip Pain Descriptors / Indicators: Sore Pain Intervention(s): Limited activity  within patient's tolerance;Monitored during session;Premedicated before session;Repositioned;Ice applied  Home Living                                          Prior Functioning/Environment              Frequency           Progress Toward Goals  OT Goals(current goals can now be found in the care plan section)  Progress towards OT goals: Progressing toward goals (goals nearly met:  min A for shower transfer)     Plan      Co-evaluation                 End of Session     Activity  Tolerance Patient tolerated treatment well   Patient Left in bed;with call bell/phone within reach;with bed alarm set   Nurse Communication          Time: 9169-4503 OT Time Calculation (min): 11 min  Charges: OT General Charges $OT Visit: 1 Procedure OT Treatments $Self Care/Home Management : 8-22 mins  Mikayla Chiusano 05/01/2016, 8:08 AM  Lesle Chris, OTR/L [phone] 05/01/2016

## 2016-05-01 NOTE — Progress Notes (Signed)
Physical Therapy Treatment Patient Details Name: Kathleen Bates MRN: [id number] DOB: 10-Nov-1933 Today's Date: 05/01/2016    History of Present Illness L DATHA, multiple R knee surgeries, R hip surgeries, L TKA in past, s/p craniotomy for acoustic neuroma    PT Comments    Pt ambulated in hallway with RW. Pt demonstrated improved gait pattern and decreased fear of moving her L LE. Pt reports that her husband is trying to find her RW and BSC at home and will contact case manager if not found. Pt also reports feeling ready to be discharged home this evening or tomorrow AM.  Follow Up Recommendations  Home health PT;Supervision/Assistance - 24 hour     Equipment Recommendations  Rolling walker with 5" wheels (if not found by spouse at home)    Recommendations for Other Services       Precautions / Restrictions Precautions Precautions: Fall Restrictions Weight Bearing Restrictions: No Other Position/Activity Restrictions: WBAT    Mobility  Bed Mobility Overal bed mobility: Needs Assistance Bed Mobility: Supine to Sit;Sit to Supine     Supine to sit: Min guard;HOB elevated Sit to supine: Min guard   General bed mobility comments: guarding for safety; verbal cues for safe, controlled technique  Transfers Overall transfer level: Needs assistance Equipment used: Rolling walker (2 wheeled) Transfers: Sit to/from Stand Sit to Stand: Min guard         General transfer comment: guarding for safety; verbal cues for UE positioning and to L LE positioning; assitance for raise and lower from bed  Ambulation/Gait Ambulation/Gait assistance: Min guard Ambulation Distance (Feet): 120 Feet Assistive device: Rolling walker (2 wheeled) Gait Pattern/deviations: Step-through pattern;Decreased stride length     General Gait Details: guarding for safety; pt continued to demonstrate improved gait pattern and speed   Stairs            Wheelchair Mobility    Modified Rankin  (Stroke Patients Only)       Balance                                    Cognition Arousal/Alertness: Awake/alert Behavior During Therapy: WFL for tasks assessed/performed Overall Cognitive Status: Within Functional Limits for tasks assessed                      Exercises      General Comments        Pertinent Vitals/Pain Pain Assessment: 0-10 Pain Score: 7  Pain Location: L hip Pain Descriptors / Indicators: Aching;Discomfort;Sore Pain Intervention(s): Limited activity within patient's tolerance;Monitored during session;Repositioned;Premedicated before session;Ice applied    Home Living                      Prior Function            PT Goals (current goals can now be found in the care plan section) Progress towards PT goals: Progressing toward goals    Frequency    7X/week      PT Plan Current plan remains appropriate    Co-evaluation             End of Session Equipment Utilized During Treatment: Gait belt Activity Tolerance: Patient tolerated treatment well Patient left: in bed;with bed alarm set;with call bell/phone within reach     Time: 1416-1432 PT Time Calculation (min) (ACUTE ONLY): 16 min  Charges:  $Gait Training: 8-22 mins  G Codes:      Dewitt Hoes 05/01/2016, 3:12 PM Dewitt Hoes, SPT

## 2016-05-01 NOTE — Discharge Summary (Signed)
Physician Discharge Summary   Patient ID: Kathleen Bates MRN: [id number] DOB/AGE: October 16, 1933 82 y.o.  Admit date: 04/29/2016 Discharge date: 05/01/2016  Primary Diagnosis:  Osteoarthritis of the Left hip.   Admission Diagnoses:  Past Medical History:  Diagnosis Date  . Anemia   . Anxiety   . Asthma   . Balance problem MILD-- POST ACOUSTIC NEUROMA  . Cancer ([place])   . Cataract immature BILATERAL  . Chronic back pain    CHRONIC NARCTIC -- GOES TO PAIN CLINIC  . Chronic cystitis   . Chronic kidney disease    polynephritis/freq. uti  . Constipated   . Depression   . Dry mouth    POST ACOUSTIC NEUROMA  . Fibromyalgia   . Frequency of urination   . GERD (gastroesophageal reflux disease)   . Headache   . Hemorrhoids   . History of cervical cancer S/P VAG. HYSTECTOMY AGE 58  . Hypercholesteremia   . Hypertension   . IBS (irritable bowel syndrome)   . 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 Incontinence of urine   . Infection of ear 03/31/2016   staph in left ear  . Insomnia   . Interstitial cystitis   . Lumbar stenosis   . Macular degeneration of both eyes   . Nocturia   . Ringing in ear    right  . SOB (shortness of breath) on exertion   . Status post excision of acoustic neuroma 1988   RESIDUAL--  RIGHT HEARING LOSS, MILD IMPAIRED BALANCE, AND DRY MOUTH  . Urgency of urination    Discharge Diagnoses:   Principal Problem:   OA (osteoarthritis) of hip  Estimated body mass index is 28.59 kg/m as calculated from the following:   Height as of this encounter: 5' 8"  (1.727 m).   Weight as of this encounter: 85.3 kg (188 lb).  Procedure(s) (LRB): LEFT TOTAL HIP ARTHROPLASTY ANTERIOR APPROACH (Left)   Consults: None  HPI: Kathleen Bates is a 82 y.o. female who has advanced end-  stage arthritis of their Left  hip with progressively worsening pain and  dysfunction.The patient has failed nonoperative management and presents for  total hip arthroplasty.    Laboratory Data: Admission on 04/29/2016  Component Date Value Ref Range Status  . WBC 04/30/2016 11.8* 4.0 - 10.5 K/uL Final  . RBC 04/30/2016 3.46* 3.87 - 5.11 MIL/uL Final  . Hemoglobin 04/30/2016 10.2* 12.0 - 15.0 g/dL Final  . HCT 04/30/2016 30.9* 36.0 - 46.0 % Final  . MCV 04/30/2016 89.3  78.0 - 100.0 fL Final  . MCH 04/30/2016 29.5  26.0 - 34.0 pg Final  . MCHC 04/30/2016 33.0  30.0 - 36.0 g/dL Final  . RDW 04/30/2016 12.2  11.5 - 15.5 % Final  . Platelets 04/30/2016 159  150 - 400 K/uL Final  . Sodium 04/30/2016 139  135 - 145 mmol/L Final  . Potassium 04/30/2016 4.8  3.5 - 5.1 mmol/L Final  . Chloride 04/30/2016 103  101 - 111 mmol/L Final  . CO2 04/30/2016 30  22 - 32 mmol/L Final  . Glucose, Bld 04/30/2016 136* 65 - 99 mg/dL Final  . BUN 04/30/2016 7  6 - 20 mg/dL Final  . Creatinine, Ser 04/30/2016 0.65  0.44 - 1.00 mg/dL Final  . Calcium 04/30/2016 8.5* 8.9 - 10.3 mg/dL Final  . GFR calc non Af Amer 04/30/2016 >60  >60 mL/min Final  . GFR calc Af Amer 04/30/2016 >60  >60 mL/min  Final   Comment: (NOTE) The eGFR has been calculated using the CKD EPI equation. This calculation has not been validated in all clinical situations. eGFR's persistently <60 mL/min signify possible Chronic Kidney Disease.   . Anion gap 04/30/2016 6  5 - 15 Final  . Color, Urine 04/30/2016 YELLOW  YELLOW Final  . APPearance 04/30/2016 CLEAR  CLEAR Final  . Specific Gravity, Urine 04/30/2016 1.007  1.005 - 1.030 Final  . pH 04/30/2016 7.5  5.0 - 8.0 Final  . Glucose, UA 04/30/2016 NEGATIVE  NEGATIVE mg/dL Final  . Hgb urine dipstick 04/30/2016 NEGATIVE  NEGATIVE Final  . Bilirubin Urine 04/30/2016 NEGATIVE  NEGATIVE Final  . Ketones, ur 04/30/2016 NEGATIVE  NEGATIVE mg/dL Final  . Protein, ur 04/30/2016 NEGATIVE  NEGATIVE mg/dL Final  . Nitrite 04/30/2016 NEGATIVE  NEGATIVE Final  . Leukocytes, UA 04/30/2016 NEGATIVE  NEGATIVE Final  . WBC 05/01/2016 12.7* 4.0 - 10.5 K/uL Final  . RBC  05/01/2016 3.73* 3.87 - 5.11 MIL/uL Final  . Hemoglobin 05/01/2016 11.2* 12.0 - 15.0 g/dL Final  . HCT 05/01/2016 34.4* 36.0 - 46.0 % Final  . MCV 05/01/2016 92.2  78.0 - 100.0 fL Final  . MCH 05/01/2016 30.0  26.0 - 34.0 pg Final  . MCHC 05/01/2016 32.6  30.0 - 36.0 g/dL Final  . RDW 05/01/2016 12.6  11.5 - 15.5 % Final  . Platelets 05/01/2016 186  150 - 400 K/uL Final  . Sodium 05/01/2016 141  135 - 145 mmol/L Final  . Potassium 05/01/2016 4.1  3.5 - 5.1 mmol/L Final  . Chloride 05/01/2016 102  101 - 111 mmol/L Final  . CO2 05/01/2016 32  22 - 32 mmol/L Final  . Glucose, Bld 05/01/2016 117* 65 - 99 mg/dL Final  . BUN 05/01/2016 12  6 - 20 mg/dL Final  . Creatinine, Ser 05/01/2016 0.53  0.44 - 1.00 mg/dL Final  . Calcium 05/01/2016 9.2  8.9 - 10.3 mg/dL Final  . GFR calc non Af Amer 05/01/2016 >60  >60 mL/min Final  . GFR calc Af Amer 05/01/2016 >60  >60 mL/min Final   Comment: (NOTE) The eGFR has been calculated using the CKD EPI equation. This calculation has not been validated in all clinical situations. eGFR's persistently <60 mL/min signify possible Chronic Kidney Disease.   Georgiann Hahn gap 05/01/2016 7  5 - 15 Final  Hospital Outpatient Visit on 04/23/2016  Component Date Value Ref Range Status  . aPTT 04/23/2016 32  24 - 36 seconds Final  . WBC 04/23/2016 7.3  4.0 - 10.5 K/uL Final  . RBC 04/23/2016 4.54  3.87 - 5.11 MIL/uL Final  . Hemoglobin 04/23/2016 13.8  12.0 - 15.0 g/dL Final  . HCT 04/23/2016 41.8  36.0 - 46.0 % Final  . MCV 04/23/2016 92.1  78.0 - 100.0 fL Final  . MCH 04/23/2016 30.4  26.0 - 34.0 pg Final  . MCHC 04/23/2016 33.0  30.0 - 36.0 g/dL Final  . RDW 04/23/2016 12.5  11.5 - 15.5 % Final  . Platelets 04/23/2016 191  150 - 400 K/uL Final  . Sodium 04/23/2016 139  135 - 145 mmol/L Final  . Potassium 04/23/2016 4.3  3.5 - 5.1 mmol/L Final  . Chloride 04/23/2016 102  101 - 111 mmol/L Final  . CO2 04/23/2016 30  22 - 32 mmol/L Final  . Glucose, Bld 04/23/2016  102* 65 - 99 mg/dL Final  . BUN 04/23/2016 16  6 - 20 mg/dL Final  . Creatinine, Ser  04/23/2016 0.76  0.44 - 1.00 mg/dL Final  . Calcium 04/23/2016 9.2  8.9 - 10.3 mg/dL Final  . Total Protein 04/23/2016 8.0  6.5 - 8.1 g/dL Final  . Albumin 04/23/2016 4.4  3.5 - 5.0 g/dL Final  . AST 04/23/2016 61* 15 - 41 U/L Final  . ALT 04/23/2016 46  14 - 54 U/L Final  . Alkaline Phosphatase 04/23/2016 97  38 - 126 U/L Final  . Total Bilirubin 04/23/2016 0.7  0.3 - 1.2 mg/dL Final  . GFR calc non Af Amer 04/23/2016 >60  >60 mL/min Final  . GFR calc Af Amer 04/23/2016 >60  >60 mL/min Final   Comment: (NOTE) The eGFR has been calculated using the CKD EPI equation. This calculation has not been validated in all clinical situations. eGFR's persistently <60 mL/min signify possible Chronic Kidney Disease.   . Anion gap 04/23/2016 7  5 - 15 Final  . Prothrombin Time 04/23/2016 12.9  11.4 - 15.2 seconds Final  . INR 04/23/2016 0.98   Final  . ABO/RH(D) 04/29/2016 A POS   Final  . Antibody Screen 04/29/2016 NEG   Final  . Sample Expiration 04/29/2016 05/02/2016   Final  . Extend sample reason 04/29/2016 NO TRANSFUSIONS OR PREGNANCY IN THE PAST 3 MONTHS   Final  . MRSA, PCR 04/23/2016 NEGATIVE  NEGATIVE Final  . Staphylococcus aureus 04/23/2016 NEGATIVE  NEGATIVE Final   Comment:        The Xpert SA Assay (FDA approved for NASAL specimens in patients over 32 years of age), is one component of a comprehensive surveillance program.  Test performance has been validated by [facility] for patients greater than or equal to 79 year old. It is not intended to diagnose infection nor to guide or monitor treatment.   . ABO/RH(D) 04/24/2016 A POS   Final  . Color, Urine 04/24/2016 YELLOW  YELLOW Final  . APPearance 04/24/2016 CLOUDY* CLEAR Final  . Specific Gravity, Urine 04/24/2016 1.015  1.005 - 1.030 Final  . pH 04/24/2016 6.5  5.0 - 8.0 Final  . Glucose, UA 04/24/2016 NEGATIVE  NEGATIVE mg/dL Final    . Hgb urine dipstick 04/24/2016 NEGATIVE  NEGATIVE Final  . Bilirubin Urine 04/24/2016 NEGATIVE  NEGATIVE Final  . Ketones, ur 04/24/2016 NEGATIVE  NEGATIVE mg/dL Final  . Protein, ur 04/24/2016 NEGATIVE  NEGATIVE mg/dL Final  . Nitrite 04/24/2016 NEGATIVE  NEGATIVE Final  . Leukocytes, UA 04/24/2016 NEGATIVE  NEGATIVE Final     X-Rays:Dg Pelvis Portable  Result Date: 04/29/2016 CLINICAL DATA:  Left hip replacement, followup EXAM: PORTABLE PELVIS 1-2 VIEWS COMPARISON:  None. FINDINGS: The left femoral and acetabular components of the left total hip replacement appear to be in good position on the portable view obtained. No complicating features are seen. The pelvic rami are intact. There is some degenerative change of the right hip noted. IMPRESSION: Left total hip replacement components in good position. No complicating features. Electronically Signed   By: Ivar Drape M.D.   On: 04/29/2016 09:38   Dg C-arm 1-60 Min-no Report  Result Date: 04/29/2016 CLINICAL DATA: surgery C-ARM 1-60 MINUTES Fluoroscopy was utilized by the requesting physician.  No radiographic interpretation.    EKG: Orders placed or performed during the hospital encounter of 11/26/15  . ED EKG  . ED EKG  . EKG 12-Lead  . EKG 12-Lead  . EKG     Hospital Course: Patient was admitted to [facility] and taken to the OR and underwent the  above state procedure without complications.  Patient tolerated the procedure well and was later transferred to the recovery room and then to the orthopaedic floor for postoperative care.  They were given PO and IV analgesics for pain control following their surgery.  They were given 24 hours of postoperative antibiotics of  Anti-infectives    Start     Dose/Rate Route Frequency Ordered Stop   04/29/16 1900  vancomycin (VANCOCIN) IVPB 1000 mg/200 mL premix     1,000 mg 200 mL/hr over 60 Minutes Intravenous Every 12 hours 04/29/16 1024 04/29/16 2109   04/29/16 0730  vancomycin  (VANCOCIN) 1,000 mg in sodium chloride 0.9 % 250 mL IVPB  Status:  Discontinued     1,000 mg 250 mL/hr over 60 Minutes Intravenous  Once 04/29/16 0716 04/29/16 0722   04/29/16 0730  vancomycin (VANCOCIN) IVPB 1000 mg/200 mL premix     1,000 mg 200 mL/hr over 60 Minutes Intravenous  Once 04/29/16 0722     04/29/16 0715  vancomycin (VANCOCIN) IVPB 1000 mg/200 mL premix     1,000 mg 200 mL/hr over 60 Minutes Intravenous  Once 04/29/16 0710 04/29/16 0716     and started on DVT prophylaxis in the form of Xarelto.   PT and OT were ordered for total hip protocol.  The patient was allowed to be WBAT with therapy. Discharge planning was consulted to help with postop disposition and equipment needs.  Patient had a rough night on the evening of surgery.  They started to get up OOB with therapy on day one.  Hemovac drain was pulled without difficulty. Family in room. Encouraged bowel meds due to chronic opioid use. Started on Movantik in house. Continued to work with therapy into day two.  Dressing was changed on day two and the incision was healing well.  Patient was seen in rounds on POD 2 by Dr. Wynelle Link, improving with therapy and was ready to go home.  Discharge home with home health Diet - Cardiac diet Follow up - in 2 weeks Activity - WBAT Disposition - Home Condition Upon Discharge - Stable D/C Meds - See DC Summary DVT Prophylaxis - Xarelto  Discharge Instructions    Call MD / Call 911    Complete by:  As directed    If you experience chest pain or shortness of breath, CALL 911 and be transported to the hospital emergency room.  If you develope a fever above 101 F, pus (white drainage) or increased drainage or redness at the wound, or calf pain, call your surgeon's office.   Change dressing    Complete by:  As directed    You may change your dressing dressing daily with sterile 4 x 4 inch gauze dressing and paper tape.  Do not submerge the incision under water.   Constipation Prevention     Complete by:  As directed    Drink plenty of fluids.  Prune juice may be helpful.  You may use a stool softener, such as Colace (over the counter) 100 mg twice a day.  Use MiraLax (over the counter) for constipation as needed.   Diet - low sodium heart healthy    Complete by:  As directed    Discharge instructions    Complete by:  As directed    Pick up stool softner and laxative for home use following surgery while on pain medications. Do not submerge incision under water. Please use good hand washing techniques while changing dressing each day. May shower starting  three days after surgery. Please use a clean towel to pat the incision dry following showers. Continue to use ice for pain and swelling after surgery. Do not use any lotions or creams on the incision until instructed by your surgeon.   Postoperative Constipation Protocol  Constipation - defined medically as fewer than three stools per week and severe constipation as less than one stool per week.  One of the most common issues patients have following surgery is constipation.  Even if you have a regular bowel pattern at home, your normal regimen is likely to be disrupted due to multiple reasons following surgery.  Combination of anesthesia, postoperative narcotics, change in appetite and fluid intake all can affect your bowels.  In order to avoid complications following surgery, here are some recommendations in order to help you during your recovery period.  Colace (docusate) - Pick up an over-the-counter form of Colace or another stool softener and take twice a day as long as you are requiring postoperative pain medications.  Take with a full glass of water daily.  If you experience loose stools or diarrhea, hold the colace until you stool forms back up.  If your symptoms do not get better within 1 week or if they get worse, check with your doctor.  Dulcolax (bisacodyl) - Pick up over-the-counter and take as directed by the product  packaging as needed to assist with the movement of your bowels.  Take with a full glass of water.  Use this product as needed if not relieved by Colace only.   MiraLax (polyethylene glycol) - Pick up over-the-counter to have on hand.  MiraLax is a solution that will increase the amount of water in your bowels to assist with bowel movements.  Take as directed and can mix with a glass of water, juice, soda, coffee, or tea.  Take if you go more than two days without a movement. Do not use MiraLax more than once per day. Call your doctor if you are still constipated or irregular after using this medication for 7 days in a row.  If you continue to have problems with postoperative constipation, please contact the office for further assistance and recommendations.  If you experience "the worst abdominal pain ever" or develop nausea or vomiting, please contact the office immediatly for further recommendations for treatment.   Take Xarelto for two and a half more weeks, then discontinue Xarelto. Once the patient has completed the blood thinner regimen, then take a Baby 81 mg Aspirin daily for three more weeks.   Do not sit on low chairs, stoools or toilet seats, as it may be difficult to get up from low surfaces    Complete by:  As directed    Driving restrictions    Complete by:  As directed    No driving until released by the physician.   Increase activity slowly as tolerated    Complete by:  As directed    Lifting restrictions    Complete by:  As directed    No lifting until released by the physician.   Patient may shower    Complete by:  As directed    You may shower without a dressing once there is no drainage.  Do not wash over the wound.  If drainage remains, do not shower until drainage stops.   TED hose    Complete by:  As directed    Use stockings (TED hose) for 3 weeks on both leg(s).  You may remove them  at night for sleeping.   Weight bearing as tolerated    Complete by:  As directed      Laterality:  left   Extremity:  Lower       Medication List    STOP taking these medications   cefUROXime 250 MG tablet Commonly known as:  CEFTIN   ciprofloxacin 500 MG tablet Commonly known as:  CIPRO   lactobacillus acidophilus & bulgar chewable tablet     TAKE these medications   acetaminophen 325 MG tablet Commonly known as:  TYLENOL Take 650 mg by mouth every 6 (six) hours as needed for headache.   ALIGN PO Take 1 tablet by mouth daily.   ALPRAZolam 0.25 MG tablet Commonly known as:  XANAX Take 0.25 mg by mouth at bedtime.   amLODipine 10 MG tablet Commonly known as:  NORVASC Take 10 mg by mouth daily.   benazepril 40 MG tablet Commonly known as:  LOTENSIN TAKE 1 TABLET TWICE DAILY   diphenhydrAMINE 25 mg capsule Commonly known as:  BENADRYL Take 25 mg by mouth every 6 (six) hours as needed for allergies.   DULCOLAX 5 MG EC tablet Generic drug:  bisacodyl Take 10 mg by mouth at bedtime.   famotidine 40 MG tablet Commonly known as:  PEPCID Take 40 mg by mouth 2 (two) times daily as needed for heartburn or indigestion (acid reflux).   FLUoxetine 40 MG capsule Commonly known as:  PROZAC Take 40 mg by mouth daily.   methocarbamol 500 MG tablet Commonly known as:  ROBAXIN Take 1 tablet (500 mg total) by mouth every 6 (six) hours as needed for muscle spasms.   naloxegol oxalate 12.5 MG Tabs tablet Commonly known as:  MOVANTIK Take 1 tablet (12.5 mg total) by mouth daily.   ondansetron 4 MG tablet Commonly known as:  ZOFRAN Take 1 tablet (4 mg total) by mouth every 6 (six) hours as needed for nausea.   oxyCODONE 40 mg 12 hr tablet Commonly known as:  OXYCONTIN Take 40 mg by mouth 2 (two) times daily. Takes twice a day, then a 30 mg once a day when need litte. What changed:  Another medication with the same name was changed. Make sure you understand how and when to take each.   OXYCONTIN 30 MG 12 hr tablet Generic drug:  oxyCODONE Take 30 mg by  mouth daily. Takes in addition to oxycontin 40 mg twice a day What changed:  Another medication with the same name was changed. Make sure you understand how and when to take each.   Oxycodone HCl 10 MG Tabs Take 1-2 tablets (10-20 mg total) by mouth every 3 (three) hours as needed for severe pain or breakthrough pain. What changed:  how much to take  when to take this  reasons to take this   POLYETHYLENE GLYCOL 3350 PO Take 17 g by mouth daily as needed (for constipation.).   rivaroxaban 10 MG Tabs tablet Commonly known as:  XARELTO Take 1 tablet (10 mg total) by mouth daily with breakfast. Take Xarelto for two and a half more weeks, then discontinue Xarelto. Once the patient has completed the blood thinner regimen, then take a Baby 81 mg Aspirin daily for three more weeks.   SOOTHE 0.6-0.6 % Soln Generic drug:  Propylene Glycol-Glycerin Place 1 drop into both eyes daily as needed. Dry eyes   VICKS BABYRUB EX Apply 1 application topically at bedtime as needed. congestion      Follow-up Information  [facility] .   Why:  now known as [facility].  This agency wil call you to schedule your at home physical therapy. Contact information: [address] [place] [address] [phone]        Gearlean Alf, MD. Schedule an appointment as soon as possible for a visit on 05/12/2016.   Specialty:  Orthopedic Surgery Why:  Call office at [phone] to setup appointment on Tuesday 05/12/2016 with Dr. Wynelle Link. Contact information: [address] [place] [address] [phone]           Signed: Arlee Muslim, PA-C Orthopaedic Surgery 05/01/2016, 8:17 AM

## 2016-05-01 NOTE — Progress Notes (Signed)
Physical Therapy Treatment Patient Details Name: Kathleen Bates MRN: [id number] DOB: 25-Nov-1933 Today's Date: 05-11-16    History of Present Illness L DATHA, multiple R knee surgeries, R hip surgeries, L TKA in past, s/p craniotomy for acoustic neuroma    PT Comments    Pt ambulated in hallway with RW. Pt demonstrated improved gait pattern and increased distance.   Follow Up Recommendations  Home health PT;Supervision/Assistance - 24 hour     Equipment Recommendations  Rolling walker with 5" wheels    Recommendations for Other Services       Precautions / Restrictions Precautions Precautions: Fall Restrictions Weight Bearing Restrictions: No Other Position/Activity Restrictions: WBAT    Mobility  Bed Mobility Overal bed mobility: Needs Assistance Bed Mobility: Supine to Sit;Sit to Supine     Supine to sit: Min guard;HOB elevated Sit to supine: Min guard   General bed mobility comments: guarding for safety; verbal cues for safe, controlled technique  Transfers Overall transfer level: Needs assistance Equipment used: Rolling walker (2 wheeled) Transfers: Sit to/from Stand Sit to Stand: Min assist         General transfer comment: assist for steadying upon rise and for controlled descent; verbal cues for UE positioning and to L LE positioning; assitance for raise and lower from bed  Ambulation/Gait Ambulation/Gait assistance: Min guard Ambulation Distance (Feet): 120 Feet Assistive device: Rolling walker (2 wheeled) Gait Pattern/deviations: Decreased stride length;Decreased stance time - left;Step-through pattern     General Gait Details: assistance for steadying and safety; pt demonstrated improved gait pattern and speed   Stairs            Wheelchair Mobility    Modified Rankin (Stroke Patients Only)       Balance                                    Cognition Arousal/Alertness: Awake/alert Behavior During Therapy: WFL for  tasks assessed/performed Overall Cognitive Status: Within Functional Limits for tasks assessed                      Exercises      General Comments        Pertinent Vitals/Pain Pain Assessment: 0-10 Pain Score: 2  Pain Location: L hip Pain Descriptors / Indicators: Aching;Discomfort Pain Intervention(s): Limited activity within patient's tolerance;Monitored during session;Repositioned;Ice applied    Home Living                      Prior Function            PT Goals (current goals can now be found in the care plan section) Progress towards PT goals: Progressing toward goals    Frequency    7X/week      PT Plan Current plan remains appropriate    Co-evaluation             End of Session Equipment Utilized During Treatment: Gait belt Activity Tolerance: Patient tolerated treatment well Patient left: in bed;with bed alarm set;with call bell/phone within reach     Time: 0951-1009 PT Time Calculation (min) (ACUTE ONLY): 18 min  Charges:  $Gait Training: 8-22 mins                    G Codes:      Dewitt Hoes May 11, 2016, 2:00 PM Dewitt Hoes, SPT

## 2016-05-01 NOTE — Progress Notes (Signed)
Subjective: 2 Days Post-Op Procedure(s) (LRB): LEFT TOTAL HIP ARTHROPLASTY ANTERIOR APPROACH (Left) Patient reports pain as mild.   Patient seen in rounds with Dr. Wynelle Link.  Doing better today.  Abdomen a little better today. Pain improving.  Urinary frequency yesterday.  UA negative.  Patient felt to be due to her history of cystitis.  Should improve with time.  States she had a small bowel movement yesterday.  Started on Movantik yesterday. Patient is well, but has had some minor complaints of pain in the hip, requiring pain medications Patient is probably ready to go home later today if meets goals with both therapy sessions.  Objective: Vital signs in last 24 hours: Temp:  [98.2 F (36.8 C)-99.5 F (37.5 C)] 98.5 F (36.9 C) (09/29 0502) Pulse Rate:  [81-101] 91 (09/29 0502) Resp:  [18-22] 22 (09/29 0502) BP: (130-167)/(64-75) 167/75 (09/29 0502) SpO2:  [92 %-97 %] 96 % (09/29 0502)  Intake/Output from previous day:  Intake/Output Summary (Last 24 hours) at 05/01/16 0804 Last data filed at 05/01/16 0747  Gross per 24 hour  Intake              900 ml  Output             4975 ml  Net            -4075 ml    Intake/Output this shift: Total I/O In: -  Out: 375 [Urine:375]  Labs:  Recent Labs  04/30/16 0442 05/01/16 0449  HGB 10.2* 11.2*    Recent Labs  04/30/16 0442 05/01/16 0449  WBC 11.8* 12.7*  RBC 3.46* 3.73*  HCT 30.9* 34.4*  PLT 159 186    Recent Labs  04/30/16 0442 05/01/16 0449  NA 139 141  K 4.8 4.1  CL 103 102  CO2 30 32  BUN 7 12  CREATININE 0.65 0.53  GLUCOSE 136* 117*  CALCIUM 8.5* 9.2   No results for input(s): LABPT, INR in the last 72 hours.  EXAM: General - Patient is Alert, Appropriate and Oriented Extremity - Neurovascular intact Sensation intact distally Dorsiflexion/Plantar flexion intact Incision - clean, dry, no drainage Motor Function - intact, moving foot and toes well on exam.   Assessment/Plan: 2 Days Post-Op  Procedure(s) (LRB): LEFT TOTAL HIP ARTHROPLASTY ANTERIOR APPROACH (Left) Procedure(s) (LRB): LEFT TOTAL HIP ARTHROPLASTY ANTERIOR APPROACH (Left) Past Medical History:  Diagnosis Date  . Anemia   . Anxiety   . Asthma   . Balance problem MILD-- POST ACOUSTIC NEUROMA  . Cancer ([place])   . Cataract immature BILATERAL  . Chronic back pain    CHRONIC NARCTIC -- GOES TO PAIN CLINIC  . Chronic cystitis   . Chronic kidney disease    polynephritis/freq. uti  . Constipated   . Depression   . Dry mouth    POST ACOUSTIC NEUROMA  . Fibromyalgia   . Frequency of urination   . GERD (gastroesophageal reflux disease)   . Headache   . Hemorrhoids   . History of cervical cancer S/P VAG. HYSTECTOMY AGE 64  . Hypercholesteremia   . Hypertension   . IBS (irritable bowel syndrome)   . 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 Incontinence of urine   . Infection of ear 03/31/2016   staph in left ear  . Insomnia   . Interstitial cystitis   . Lumbar stenosis   . Macular degeneration of both eyes   . Nocturia   . Ringing in ear  right  . SOB (shortness of breath) on exertion   . Status post excision of acoustic neuroma 1988   RESIDUAL--  RIGHT HEARING LOSS, MILD IMPAIRED BALANCE, AND DRY MOUTH  . Urgency of urination    Principal Problem:   OA (osteoarthritis) of hip  Estimated body mass index is 28.59 kg/m as calculated from the following:   Height as of this encounter: 5\' 8"  (1.727 m).   Weight as of this encounter: 85.3 kg (188 lb). Up with therapy Discharge home with home health Diet - Cardiac diet Follow up - in 2 weeks Activity - WBAT Disposition - Home Condition Upon Discharge - Stable D/C Meds - See DC Summary DVT Prophylaxis - [place], PA-C Orthopaedic Surgery 05/01/2016, 8:04 AM

## 2016-06-17 IMAGING — CR DG CHEST 2V
1 series · 2 of 2 positions shown · non-contrast
Comparison: PA and lateral chest x-ray of no member seventeenth
[id number]

CLINICAL DATA: Preoperative study ; remote history of tobacco use,
hyperlipidemia.

EXAM:
CHEST  2 VIEW

[Series 1: dg chest 2 view · 0.14mm/px · 2 of 2 slices shown]
[im 1/2]
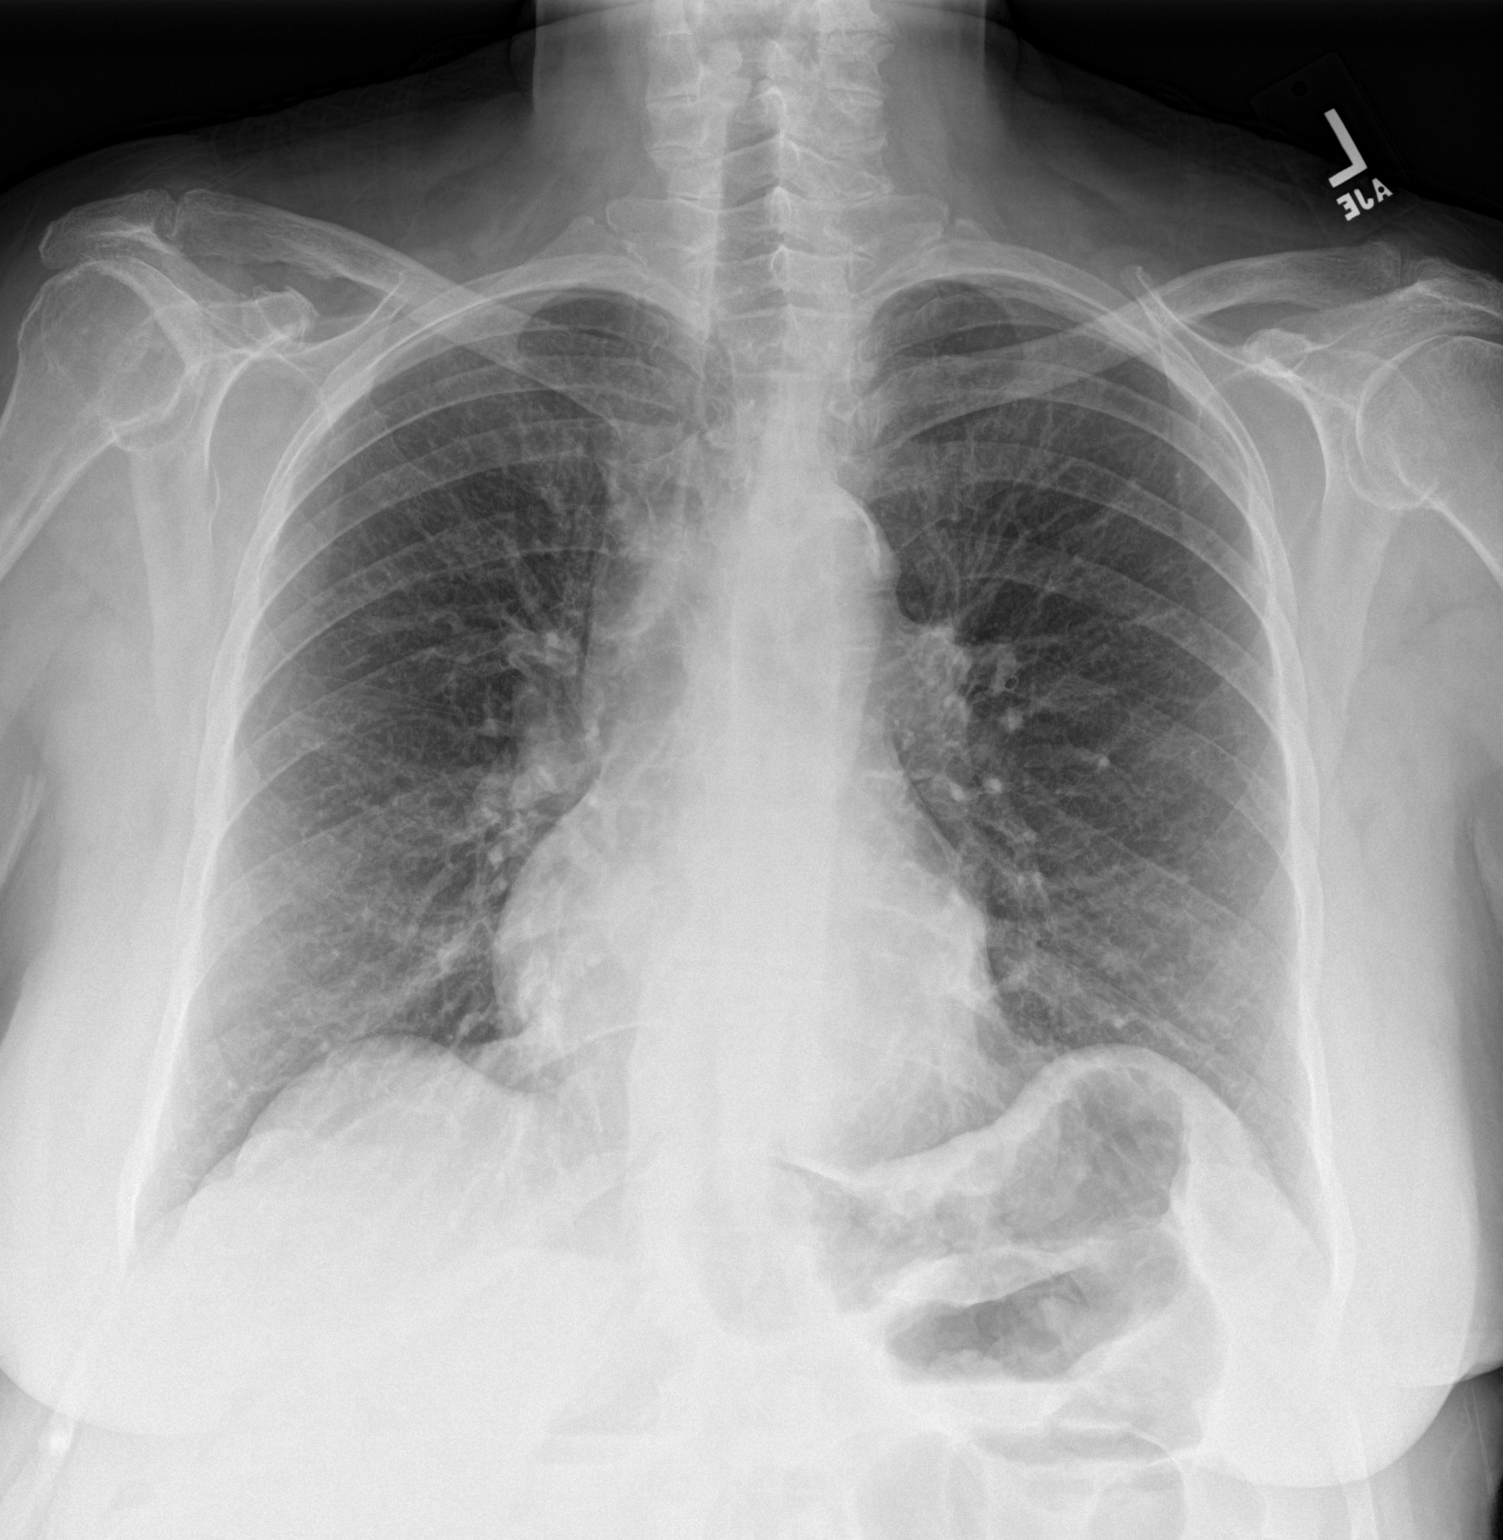
[im 2/2]
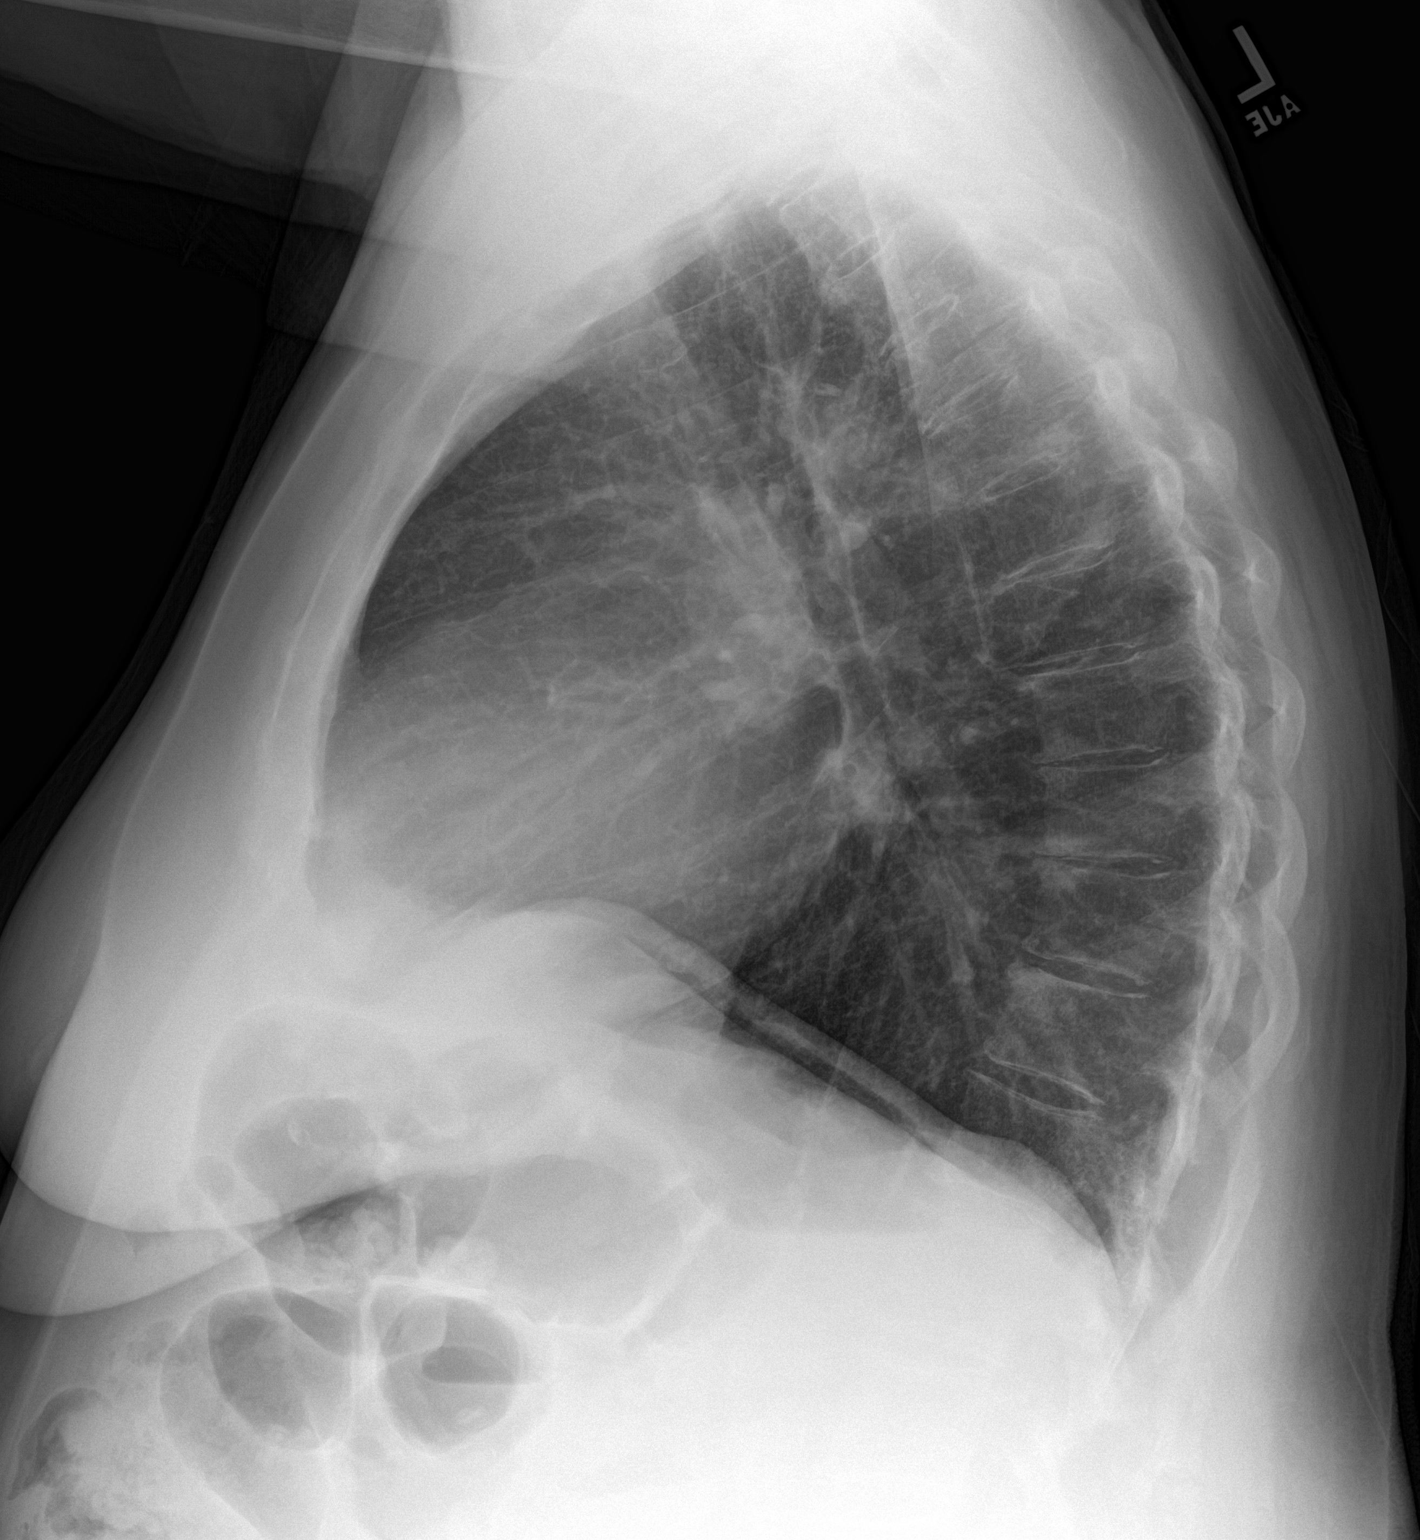

[2 of 2 positions shown; findings below may reference images not displayed]

FINDINGS: The lungs are well-expanded. The interstitial markings are coarse
bilaterally and are more conspicuous since the 8577 study. There is
no alveolar infiltrate or pleural effusion. The heart normal in
size. The pulmonary vascularity is not clearly engorged. The
mediastinum is normal in width. There is mild multilevel
degenerative disc disease of the thoracic spine.
IMPRESSION: Coarse pulmonary interstitial markings may reflect this patient's
smoking history or other fibrotic process. There is no evidence of
CHF nor alveolar pneumonia.

## 2016-07-06 ENCOUNTER — Other Ambulatory Visit (HOSPITAL_COMMUNITY): Payer: Self-pay | Admitting: Physical Medicine and Rehabilitation

## 2016-07-06 ENCOUNTER — Ambulatory Visit (HOSPITAL_COMMUNITY)
Admission: RE | Admit: 2016-07-06 | Discharge: 2016-07-06 | Disposition: A | Discharge status: Home / Self Care | Payer: Medicare Other | Source: Ambulatory Visit | Attending: Internal Medicine | Admitting: Internal Medicine

## 2016-07-06 DIAGNOSIS — M79605 Pain in left leg: Secondary | ICD-10-CM | POA: Diagnosis not present

## 2016-07-06 DIAGNOSIS — M7989 Other specified soft tissue disorders: Secondary | ICD-10-CM | POA: Diagnosis present

## 2016-07-06 NOTE — Progress Notes (Signed)
*  PRELIMINARY RESULTS* Vascular Ultrasound Left lower extremity venous duplex has been completed.  Preliminary findings: No evidence of DVT or baker's cyst.   Called results to [facility].     Landry Mellow, RDMS, RVT  07/06/2016, 2:45 PM

## 2017-02-17 IMAGING — CT CT CERVICAL SPINE W/O CM
4 of 5 series · 16 of 33 positions shown, 18 images · non-contrast
Comparison: 09/20/2015

CLINICAL DATA: Recent fall with headaches and neck pain

EXAM:
CT HEAD WITHOUT CONTRAST
CT CERVICAL SPINE WITHOUT CONTRAST
TECHNIQUE: Multidetector CT imaging of the head and cervical spine was
performed following the standard protocol without intravenous
contrast. Multiplanar CT image reconstructions of the cervical spine
were also generated.

[Series 5: soft tissue · axial · 0.39mm/px · z∈[+346,+450]mm · 4 of 85 slices shown]
[im 17/85  soft-tissue]
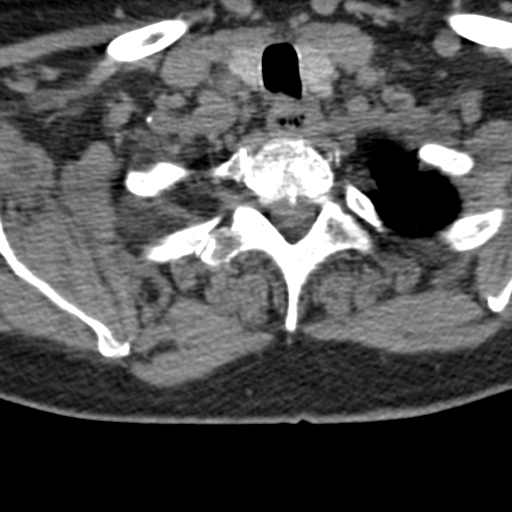
[im 34/85  soft-tissue]
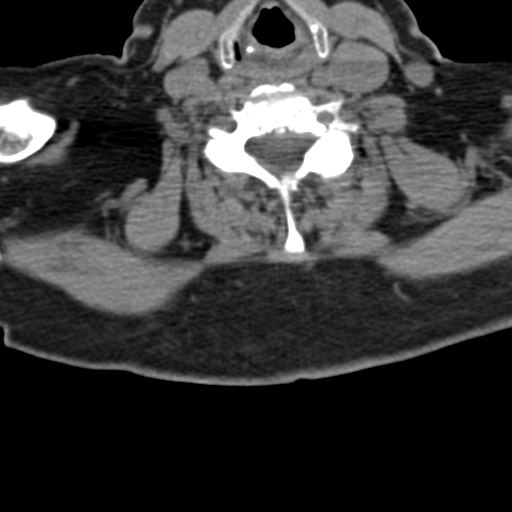
[im 51/85  soft-tissue]
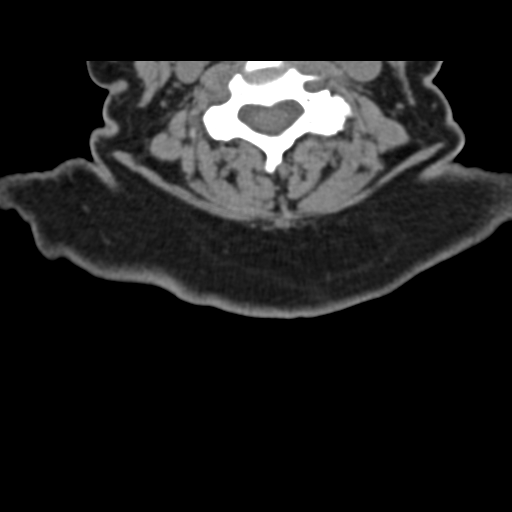
[im 68/85  soft-tissue]
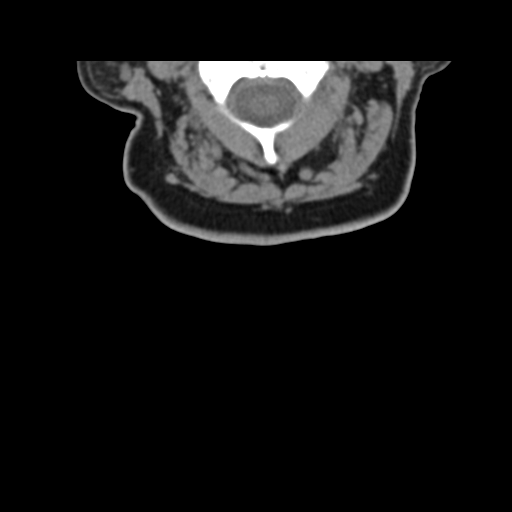

[Series 6: sagittal bone · sagittal · 0.40mm/px · 5 of 76 slices shown, 6 images]
[im 26/76  bone]
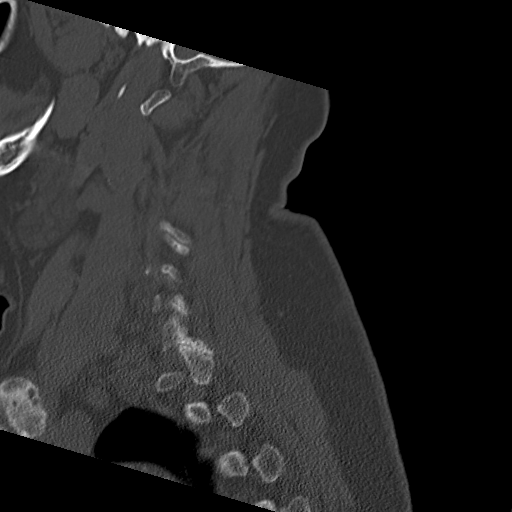
[im 32/76  bone]
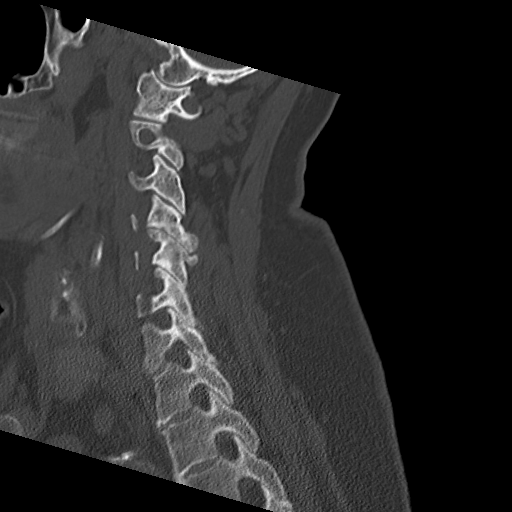
[im 38/76  soft-tissue]
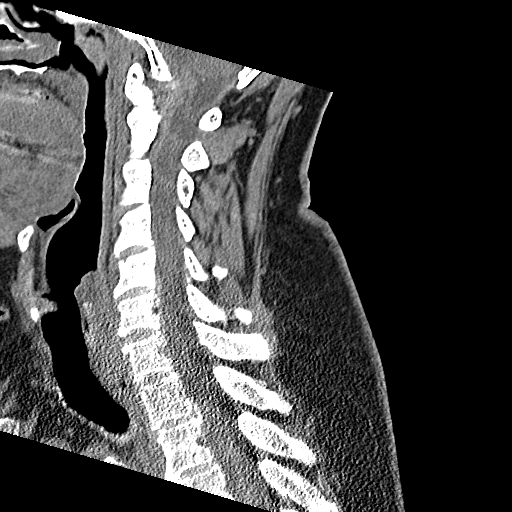
[im 38/76  bone]
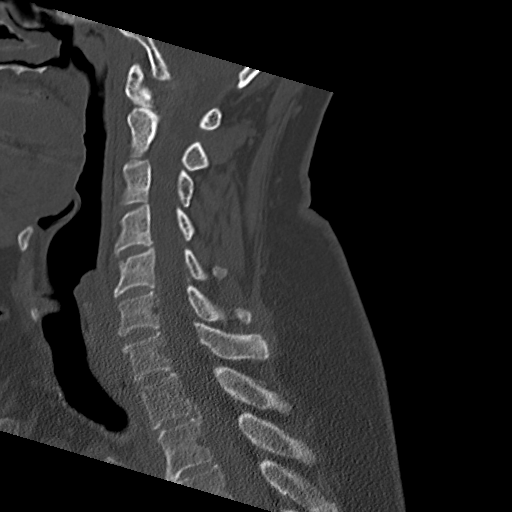
[im 44/76  bone]
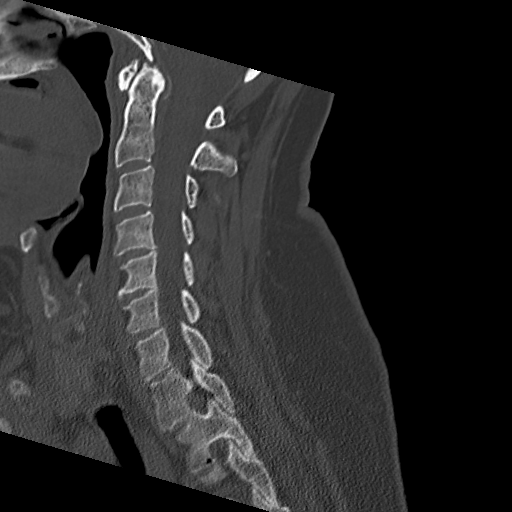
[im 51/76  bone]
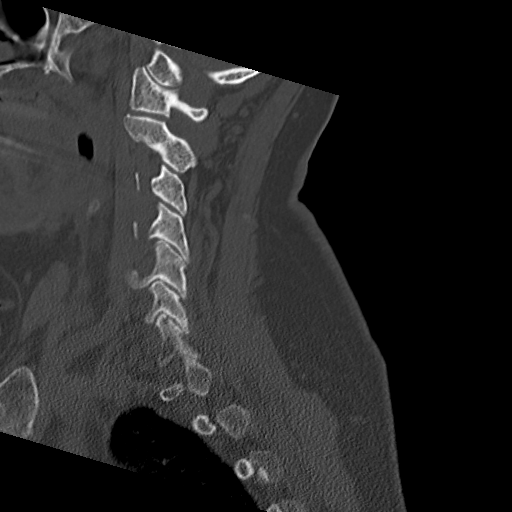

[Series 7: coronal bone · coronal · 0.33mm/px · 3 of 59 slices shown]
[im 12/59  bone]
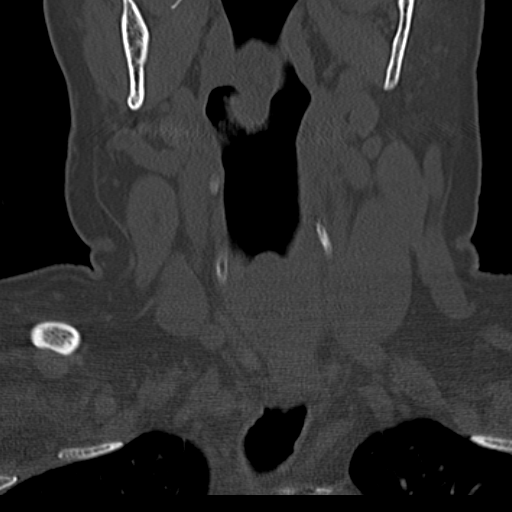
[im 24/59  bone]
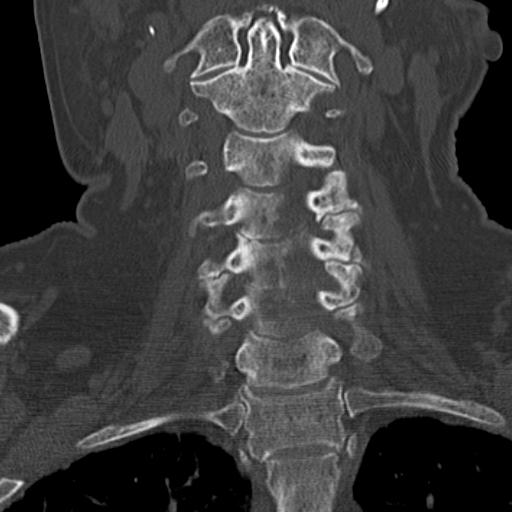
[im 35/59  bone]
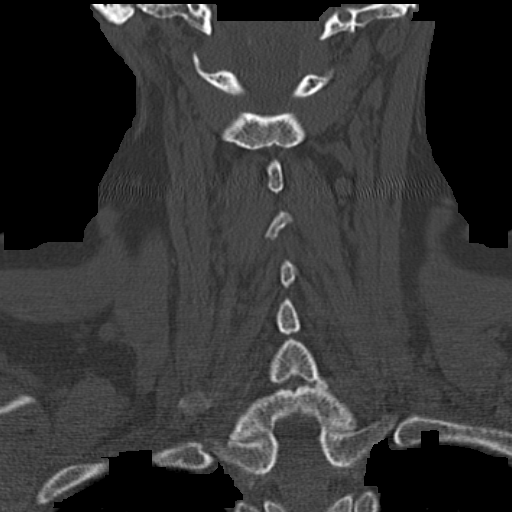

[Series 8: axial · axial · 0.31mm/px · z∈[+321,+418]mm · 4 of 89 slices shown, 5 images]
[im 18/89  soft-tissue]
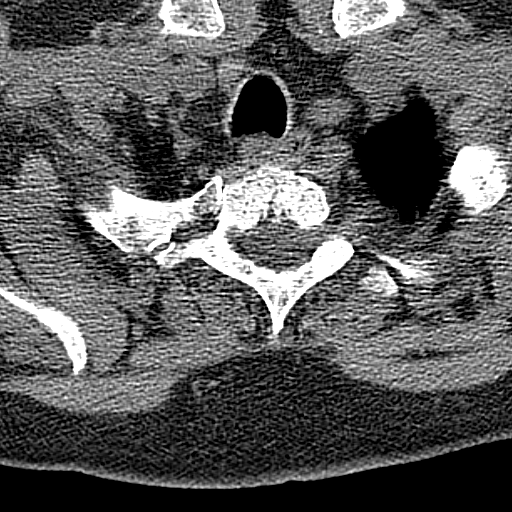
[im 18/89  bone]
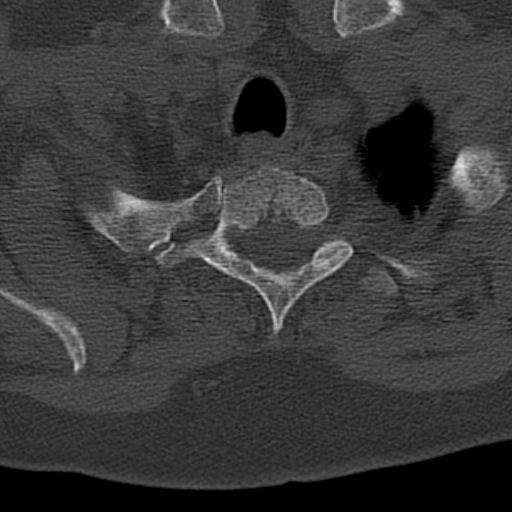
[im 36/89  bone]
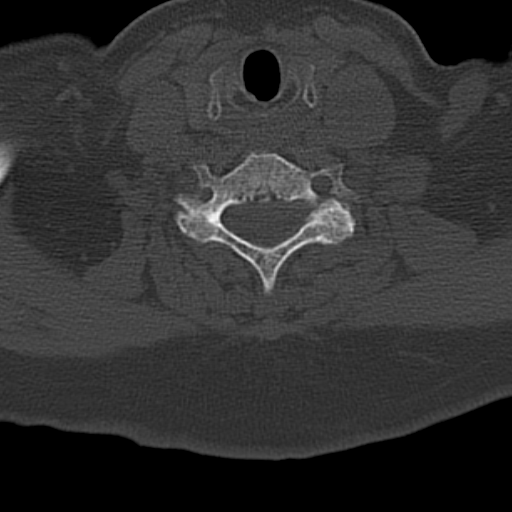
[im 53/89  bone]
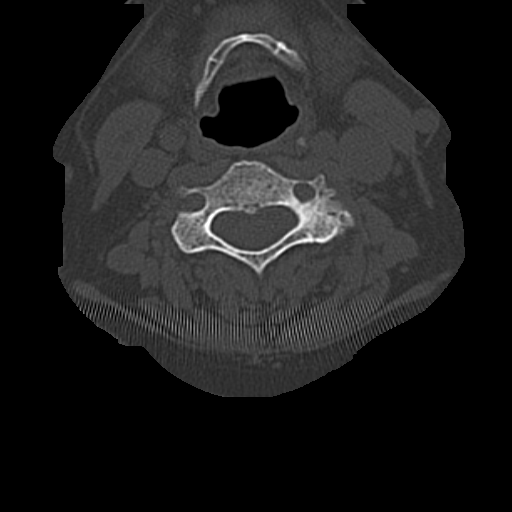
[im 71/89  bone]
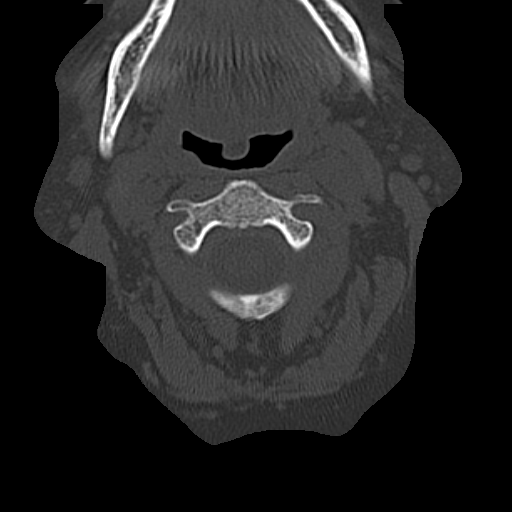

[16 of 33 positions shown; findings below may reference images not displayed]

FINDINGS: CT HEAD FINDINGS

There are changes consistent with prior right occipital craniotomy
these are stable in appearance from the prior exam. No acute bony
abnormality is seen. No soft tissue changes are noted. No findings
to suggest acute hemorrhage, acute infarction or space-occupying
mass lesion are noted.

CT CERVICAL SPINE FINDINGS

Seven cervical segments are well visualized. Vertebral body height
is well maintained. No acute fracture or acute facet abnormality is
seen. Carotid calcifications are noted. Multilevel facet
hypertrophic changes are noted. The surrounding soft tissue
structures show no acute abnormality.
IMPRESSION: CT of the head: Postsurgical changes.  No acute abnormality noted.

CT of the cervical spine: Multilevel degenerative change without
acute abnormality.

## 2017-07-10 ENCOUNTER — Emergency Department: Payer: Medicare Other

## 2017-07-10 ENCOUNTER — Encounter: Payer: Self-pay | Admitting: Emergency Medicine

## 2017-07-10 ENCOUNTER — Other Ambulatory Visit: Payer: Self-pay

## 2017-07-10 ENCOUNTER — Emergency Department
Admission: EM | Admit: 2017-07-10 | Discharge: 2017-07-10 | Disposition: A | Discharge status: Home / Self Care | Payer: Medicare Other | Attending: Emergency Medicine | Admitting: Emergency Medicine

## 2017-07-10 DIAGNOSIS — N189 Chronic kidney disease, unspecified: Secondary | ICD-10-CM | POA: Diagnosis not present

## 2017-07-10 DIAGNOSIS — J029 Acute pharyngitis, unspecified: Secondary | ICD-10-CM | POA: Insufficient documentation

## 2017-07-10 DIAGNOSIS — Z79899 Other long term (current) drug therapy: Secondary | ICD-10-CM | POA: Diagnosis not present

## 2017-07-10 DIAGNOSIS — G8929 Other chronic pain: Secondary | ICD-10-CM | POA: Insufficient documentation

## 2017-07-10 DIAGNOSIS — R05 Cough: Secondary | ICD-10-CM | POA: Diagnosis not present

## 2017-07-10 DIAGNOSIS — R1084 Generalized abdominal pain: Secondary | ICD-10-CM | POA: Insufficient documentation

## 2017-07-10 DIAGNOSIS — D631 Anemia in chronic kidney disease: Secondary | ICD-10-CM | POA: Insufficient documentation

## 2017-07-10 DIAGNOSIS — Z7901 Long term (current) use of anticoagulants: Secondary | ICD-10-CM | POA: Insufficient documentation

## 2017-07-10 DIAGNOSIS — J181 Lobar pneumonia, unspecified organism: Secondary | ICD-10-CM | POA: Insufficient documentation

## 2017-07-10 DIAGNOSIS — Z87891 Personal history of nicotine dependence: Secondary | ICD-10-CM | POA: Diagnosis not present

## 2017-07-10 DIAGNOSIS — I129 Hypertensive chronic kidney disease with stage 1 through stage 4 chronic kidney disease, or unspecified chronic kidney disease: Secondary | ICD-10-CM | POA: Insufficient documentation

## 2017-07-10 DIAGNOSIS — J45909 Unspecified asthma, uncomplicated: Secondary | ICD-10-CM | POA: Insufficient documentation

## 2017-07-10 DIAGNOSIS — R509 Fever, unspecified: Secondary | ICD-10-CM | POA: Diagnosis present

## 2017-07-10 DIAGNOSIS — J189 Pneumonia, unspecified organism: Secondary | ICD-10-CM

## 2017-07-10 LAB — URINALYSIS, COMPLETE (UACMP) WITH MICROSCOPIC
BILIRUBIN URINE: NEGATIVE
Glucose, UA: NEGATIVE mg/dL
HGB URINE DIPSTICK: NEGATIVE
KETONES UR: NEGATIVE mg/dL
Nitrite: NEGATIVE
PROTEIN: NEGATIVE mg/dL
Specific Gravity, Urine: 1.017 (ref 1.005–1.030)
pH: 5 (ref 5.0–8.0)

## 2017-07-10 LAB — CBC
HEMATOCRIT: 38.1 % (ref 35.0–47.0)
Hemoglobin: 13 g/dL (ref 12.0–16.0)
MCH: 30.5 pg (ref 26.0–34.0)
MCHC: 34.1 g/dL (ref 32.0–36.0)
MCV: 89.3 fL (ref 80.0–100.0)
Platelets: 208 10*3/uL (ref 150–440)
RBC: 4.26 MIL/uL (ref 3.80–5.20)
RDW: 12.6 % (ref 11.5–14.5)
WBC: 8.3 10*3/uL (ref 3.6–11.0)

## 2017-07-10 LAB — COMPREHENSIVE METABOLIC PANEL
ALT: 11 U/L — AB (ref 14–54)
AST: 15 U/L (ref 15–41)
Albumin: 3.3 g/dL — ABNORMAL LOW (ref 3.5–5.0)
Alkaline Phosphatase: 93 U/L (ref 38–126)
Anion gap: 10 (ref 5–15)
BUN: 16 mg/dL (ref 6–20)
CHLORIDE: 101 mmol/L (ref 101–111)
CO2: 28 mmol/L (ref 22–32)
Calcium: 8.3 mg/dL — ABNORMAL LOW (ref 8.9–10.3)
Creatinine, Ser: 0.73 mg/dL (ref 0.44–1.00)
Glucose, Bld: 111 mg/dL — ABNORMAL HIGH (ref 65–99)
Potassium: 3 mmol/L — ABNORMAL LOW (ref 3.5–5.1)
SODIUM: 139 mmol/L (ref 135–145)
Total Bilirubin: 0.4 mg/dL (ref 0.3–1.2)
Total Protein: 7.1 g/dL (ref 6.5–8.1)

## 2017-07-10 LAB — POCT RAPID STREP A: STREPTOCOCCUS, GROUP A SCREEN (DIRECT): NEGATIVE

## 2017-07-10 LAB — LIPASE, BLOOD: LIPASE: 44 U/L (ref 11–51)

## 2017-07-10 MED ORDER — SODIUM CHLORIDE 0.9 % IV BOLUS (SEPSIS)
1000.0000 mL | Freq: Once | INTRAVENOUS | Status: AC
  Administered 2017-07-10: 1000 mL via INTRAVENOUS

## 2017-07-10 MED ORDER — LEVOFLOXACIN 750 MG PO TABS: 750.0000 mg | ORAL_TABLET | Freq: Every day | ORAL | 0 refills | Status: AC

## 2017-07-10 NOTE — ED Triage Notes (Signed)
States intermittent abdominal pain and vomiting x 1 week. Currently being treated with cipro for UTI.

## 2017-07-10 NOTE — ED Provider Notes (Signed)
[facility] Emergency Department Provider Note  Time seen: 11:28 AM  I have reviewed the triage vital signs and the nursing notes.   HISTORY  Chief Complaint Abdominal Pain and Emesis    HPI Kathleen Bates is a 83 y.o. female with a past medical history of anemia, anxiety, asthma, CKD, fibromyalgia, presents to the emergency department for 4 days of fever.  According to the patient for the past 4 or 5 days she has had a fever at home as well as a sore throat.  Patient also states a mild cough with congestion.  Patient also states she is having some right-sided abdominal pain although family states this is been ongoing for several years.  Patient states it is somewhat worse over the past several days.  Denies any current nausea vomiting or diarrhea although states she did have 1 or 2 episodes of vomiting for 5 days ago but has since resolved.  Denies any sputum production.  Patient is currently on ciprofloxacin day 7 for urinary tract infection.  Past Medical History:  Diagnosis Date  . Anemia   . Anxiety   . Asthma   . Balance problem MILD-- POST ACOUSTIC NEUROMA  . Cancer ([place])   . Cataract immature BILATERAL  . Chronic back pain    CHRONIC NARCTIC -- GOES TO PAIN CLINIC  . Chronic cystitis   . Chronic kidney disease    polynephritis/freq. uti  . Constipated   . Depression   . Dry mouth    POST ACOUSTIC NEUROMA  . Fibromyalgia   . Frequency of urination   . GERD (gastroesophageal reflux disease)   . Headache   . Hemorrhoids   . History of cervical cancer S/P VAG. HYSTECTOMY AGE 69  . Hypercholesteremia   . Hypertension   . IBS (irritable bowel syndrome)   . 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 Incontinence of urine   . Infection of ear 03/31/2016   staph in left ear  . Insomnia   . Interstitial cystitis   . Lumbar stenosis   . Macular degeneration of both eyes   . Nocturia   . Ringing in ear    right  . SOB (shortness of  breath) on exertion   . Status post excision of acoustic neuroma 1988   RESIDUAL--  RIGHT HEARING LOSS, MILD IMPAIRED BALANCE, AND DRY MOUTH  . Urgency of urination     Patient Active Problem List   Diagnosis Date Noted  . OA (osteoarthritis) of hip 04/29/2016  . Abnormal findings-gastrointestinal tract   . Gastritis   . Hernia, hiatal   . Hematemesis with nausea   . ARF (acute renal failure) ([place]) 05/19/2015  . Anuria and oliguria 05/19/2015  . Chronic LBP 05/10/2015  . Chronic pain associated with significant psychosocial dysfunction 05/10/2015  . Encounter for other administrative examinations 05/10/2015  . Chronic interstitial cystitis 04/19/2015  . Cholelithiasis   . Hemorrhoid 10/09/2014  . H/O Malignant melanoma 10/09/2014  . Spinal stenosis 10/09/2014  . History of repair of rotator cuff 06/25/2014  . Complete rotator cuff rupture of left shoulder 06/15/2014  . DDD (degenerative disc disease), cervical 05/09/2014  . Airway hyperreactivity 04/03/2014  . Benign hypertension 04/03/2014  . Acid reflux 04/03/2014  . Hypercholesteremia 04/03/2014  . Fibrositis 05/24/2013  . Fibromyalgia 05/24/2013  . Apnea, sleep 01/31/2003    Past Surgical History:  Procedure Laterality Date  . ANTERIOR AND POSTERIOR REPAIR  1973  . APPENDECTOMY  AGE  59  . BRAIN SURGERY    . BREAST CYST EXCISION Left AGE 51  . CERVICAL CONIZATION W/BX  AGE 12   POST REPAIR OF BLEEDING  . CHOLECYSTECTOMY N/A 04/01/2015   Procedure: LAPAROSCOPIC CHOLECYSTECTOMY WITH INTRAOPERATIVE CHOLANGIOGRAM;  Surgeon: Marlyce Huge, MD;  Location: [facility];  Service: General;  Laterality: N/A;  . CRANIECTOMY FOR EXCISION OF ACOUSTIC [place]  . [place]  . CYSTOSCOPY WITH HYDRODISTENSION AND BIOPSY  07/24/2011   Procedure: CYSTOSCOPY/BIOPSY/HYDRODISTENSION;  Surgeon: Ailene Rud, MD;  Location: [facility];  Service: Urology;  Laterality: Right;  HOD Poss  Biopsy of Rt Ureteral Lesion Meatal Dilation   . CYSTOSCOPY WITH URETHRAL DILATATION  AGE 89  . CYSTOSCOPY/RETROGRADE/URETEROSCOPY  07/24/2011   Procedure: CYSTOSCOPY/RETROGRADE/URETEROSCOPY;  Surgeon: Ailene Rud, MD;  Location: [facility];  Service: Urology;  Laterality: Right;  Cystoscopy Right Ureteroscopy Bilateral RPG  C-ARM needed  **Patient has multiple allergies to medications**  **HX of A-fib & HTN**  . DILATION AND CURETTAGE OF UTERUS  AGE 14  . ESOPHAGOGASTRODUODENOSCOPY (EGD) WITH PROPOFOL N/A 07/02/2015   Procedure: ESOPHAGOGASTRODUODENOSCOPY (EGD) WITH PROPOFOL;  Surgeon: Lucilla Lame, MD;  Location: [facility];  Service: Endoscopy;  Laterality: N/A;  . JOINT REPLACEMENT Bilateral 2008   right knee revision x 3.   . LIPOMA RESECTION  1972   EXTENSIVE RIGHT HIP/THIGH  . [place]   no metal  . MELANOMA EXCISION  1992   LEFT SHOULDER  . NEUROPLASTY / TRANSPOSITION MEDIAN NERVE AT CARPAL TUNNEL BILATERAL  1980  . PUBOVAGINAL SLING  1998  . REPLACEMENT TOTAL KNEE  1999   RIGHT  . REVISION TOTAL KNEE ARTHROPLASTY  X2   RIGHT  . TONSILLECTOMY AND ADENOIDECTOMY  AGE 75  . TOTAL HIP ARTHROPLASTY Left 04/29/2016   Procedure: LEFT TOTAL HIP ARTHROPLASTY ANTERIOR APPROACH;  Surgeon: Gaynelle Arabian, MD;  Location: [facility];  Service: Orthopedics;  Laterality: Left;  . TOTAL KNEE ARTHROPLASTY  2006   LEFT  . TUBAL LIGATION  AGE 62  . VAGINAL HYSTERECTOMY  AGE 14    Prior to Admission medications   Medication Sig Start Date End Date Taking? Authorizing Provider  acetaminophen (TYLENOL) 325 MG tablet Take 650 mg by mouth every 6 (six) hours as needed for headache.     [provider]  ALPRAZolam Duanne Moron) 0.25 MG tablet Take 0.25 mg by mouth at bedtime.     [provider]  amLODipine (NORVASC) 10 MG tablet Take 10 mg by mouth daily.     [provider]  benazepril (LOTENSIN) 40 MG tablet TAKE 1  TABLET TWICE DAILY 04/15/15   [provider]  bisacodyl (DULCOLAX) 5 MG EC tablet Take 10 mg by mouth at bedtime.  03/23/12   [provider]  diphenhydrAMINE (BENADRYL) 25 mg capsule Take 25 mg by mouth every 6 (six) hours as needed for allergies.  03/23/12   [provider]  famotidine (PEPCID) 40 MG tablet Take 40 mg by mouth 2 (two) times daily as needed for heartburn or indigestion (acid reflux).     [provider]  FLUoxetine (PROZAC) 40 MG capsule Take 40 mg by mouth daily.     [provider]  Liniments (VICKS BABYRUB EX) Apply 1 application topically at bedtime as needed. congestion    [provider]  methocarbamol (ROBAXIN) 500 MG tablet Take 1 tablet (500 mg  total) by mouth every 6 (six) hours as needed for muscle spasms. 05/01/16   Perkins, Alexzandrew L, PA-C  naloxegol oxalate (MOVANTIK) 12.5 MG TABS tablet Take 1 tablet (12.5 mg total) by mouth daily. 05/01/16   Perkins, Alexzandrew L, PA-C  ondansetron (ZOFRAN) 4 MG tablet Take 1 tablet (4 mg total) by mouth every 6 (six) hours as needed for nausea. 05/01/16   Perkins, Alexzandrew L, PA-C  OxyCODONE (OXYCONTIN) 40 mg T12A 12 hr tablet Take 40 mg by mouth 2 (two) times daily. Takes twice a day, then a 30 mg once a day when need litte.    [provider]  oxyCODONE 10 MG TABS Take 1-2 tablets (10-20 mg total) by mouth every 3 (three) hours as needed for severe pain or breakthrough pain. 05/01/16   Perkins, Alexzandrew L, PA-C  OxyCODONE HCl ER (OXYCONTIN) 30 MG T12A Take 30 mg by mouth daily. Takes in addition to oxycontin 40 mg twice a day    [provider]  POLYETHYLENE GLYCOL 3350 PO Take 17 g by mouth daily as needed (for constipation.).    [provider]  Probiotic Product (ALIGN PO) Take 1 tablet by mouth daily.    [provider]  Propylene Glycol-Glycerin (SOOTHE) 0.6-0.6 % SOLN Place 1 drop into both eyes daily as needed. Dry eyes    [provider]  rivaroxaban (XARELTO) 10 MG TABS tablet Take 1 tablet (10 mg total) by mouth daily with breakfast. Take Xarelto for two and a half more weeks, then discontinue Xarelto. Once the patient has completed the blood thinner regimen, then take a Baby 81 mg Aspirin daily for three more weeks. 05/01/16   Perkins, Alexzandrew L, PA-C    Allergies  Allergen Reactions  . Cobalamine Combinations Swelling    Swelling in throat and the instestinal tract. Swelling in throat and the instestinal tract.  . Iodinated Diagnostic Agents Hives    Reacted to IVP dye a long time ago.  Has had several prior to this and did not react to the prior.  Reaction was in 1970's  . Ioxaglate Hives    Reacted to IVP dye a long time ago.  Has had several prior to this and did not react to the prior.  Reaction was in 1970's  . Amitriptyline Palpitations    Affects heart rate, flutter, skip beats  . Butalbital-Aspirin-Caffeine Nausea Only  . Furacin [Nitrofurazone] Rash    TOPICAL  . Penicillins Rash    Has patient had a PCN reaction causing immediate rash, facial/tongue/throat swelling, SOB or lightheadedness with hypotension: Yes Has patient had a PCN reaction causing severe rash involving mucus membranes or skin necrosis: No Has patient had a PCN reaction that required hospitalization Yes Has patient had a PCN reaction occurring within the last 10 years: No If all of the above answers are "NO", then may proceed with Cephalosporin use.   . Sulfa Antibiotics Swelling and Rash    Family History  Problem Relation Age of Onset  . Hypertension Mother   . Stroke Mother   . Cancer Father     Social History Social History   Tobacco Use  . Smoking status: Former Smoker    Packs/day: 1.50    Types: Cigarettes    Last attempt to quit: 07/22/1991    Years since quitting: 25.9  . Smokeless tobacco: Never Used  Substance Use Topics  . Alcohol use: Yes    Comment: RARE  . Drug use: No  Review of  Systems Constitutional: Positive for fever x4 5 days.  99.6 in the emergency department. ENT: Positive for sore throat Cardiovascular: Negative for chest pain. Respiratory: Negative for shortness of breath.  Occasional cough. Gastrointestinal: Right-sided abdominal pain although patient states this is chronic, currently moderate and dull. Genitourinary: Negative for dysuria All other ROS negative  ____________________________________________   PHYSICAL EXAM:  VITAL SIGNS: ED Triage Vitals  Enc Vitals Group     BP 07/10/17 0912 126/66     Pulse Rate 07/10/17 0912 87     Resp 07/10/17 0912 20     Temp 07/10/17 0912 99.6 F (37.6 C)     Temp Source 07/10/17 0912 Oral     SpO2 07/10/17 0912 97 %     Weight 07/10/17 0913 180 lb (81.6 kg)     Height 07/10/17 0913 5\' 8"  (1.727 m)     Head Circumference --      Peak Flow --      Pain Score 07/10/17 0912 5     Pain Loc --      Pain Edu? --      Excl. in [place]? --    Constitutional: Alert and oriented. Well appearing and in no distress. Eyes: Normal exam ENT   Head: Normocephalic and atraumatic.   Mouth/Throat: Mucous membranes are moist.  Mild pharyngeal erythema, status post tonsillectomy no significant hypertrophy or uvular deviation.  No exudate. Cardiovascular: Normal rate, regular rhythm.  Respiratory: Normal respiratory effort without tachypnea nor retractions. Breath sounds are clear  Gastrointestinal: Soft, mild right-sided abdominal tenderness to palpation.  Patient states this is somewhat chronic.  No rebound or guarding.  No distention. Musculoskeletal: Nontender with normal range of motion in all extremities.  Neurologic:  Normal speech and language. No gross focal neurologic deficits Skin:  Skin is warm, dry and intact.  Psychiatric: Mood and affect are normal.   ____________________________________________   RADIOLOGY  CT shows right lower lobe pneumonia. X-ray shows right lower lobe  pneumonia  ____________________________________________   INITIAL IMPRESSION / ASSESSMENT AND PLAN / ED COURSE  Pertinent labs & imaging results that were available during my care of the patient were reviewed by me and considered in my medical decision making (see chart for details).  Patient presents to the emergency department for fever times 5 days with a sore throat.  Mild pharyngeal erythema on exam.  Mild to moderate right-sided abdominal tenderness on exam.  Differential is quite wide but would include viral URI, bronchitis, pharyngitis, streptococcal pharyngitis, diverticulitis or colitis, appendicitis, gallbladder disease.  Patient's labs are largely within normal limits including urinalysis and white blood cell count.  LFTs are normal.  Lipase is normal.  Patient states slight increase in abdominal pain but family states this is chronic ongoing for at least 1-2 years.  I offered the patient a CT scan given the mild increase patient wishes to proceed with the CT scan.  We will also obtain a rapid strep screen as well as a chest x-ray.  Overall patient's symptoms are most suggestive of an upper respiratory infection likely viral in nature, low-grade temperature currently.  Patient overall appears very well, she does state some generalized fatigue and weakness we will IV hydrate while in the emergency department.  Patient agreeable to this plan of care.  CT scan and x-ray shows a right lower lobe pneumonia.  I discussed with the patient repeat x-ray in 4-6 weeks, she is agreeable to this plan of care.  Patient is allergic  to penicillins and sulfa antibiotics we will place the patient on levofloxacin 750 mg tablets for 7 days.  Patient will follow up with her doctor.  ____________________________________________   FINAL CLINICAL IMPRESSION(S) / ED DIAGNOSES  Fever Pharyngitis Chronic abdominal pain Right lower lobe pneumonia   Harvest Dark, MD 07/10/17 1253

## 2017-07-10 NOTE — Discharge Instructions (Signed)
Please follow-up with your doctor for repeat chest x-ray in 4-6 weeks to ensure resolution.  Return to the emergency department for any chest pain, any trouble breathing, or any symptom personally concerning to yourself.

## 2017-07-13 LAB — CULTURE, GROUP A STREP (THRC)

## 2017-08-05 ENCOUNTER — Ambulatory Visit: Admit: 2017-08-05 | Payer: PRIVATE HEALTH INSURANCE | Admitting: Ophthalmology

## 2017-08-05 SURGERY — PHACOEMULSIFICATION, CATARACT, WITH IOL INSERTION
Anesthesia: Choice | Laterality: Right

## 2022-10-29 ENCOUNTER — Other Ambulatory Visit: Payer: Self-pay

## 2022-10-29 DIAGNOSIS — Z86018 Personal history of other benign neoplasm: Secondary | ICD-10-CM

## 2022-12-08 ENCOUNTER — Encounter: Payer: Self-pay | Admitting: Sports Medicine

## 2022-12-08 ENCOUNTER — Other Ambulatory Visit: Payer: Self-pay | Admitting: Sports Medicine

## 2022-12-08 DIAGNOSIS — M19071 Primary osteoarthritis, right ankle and foot: Secondary | ICD-10-CM

## 2022-12-21 ENCOUNTER — Ambulatory Visit (INDEPENDENT_AMBULATORY_CARE_PROVIDER_SITE_OTHER): Payer: Medicare Other

## 2022-12-21 DIAGNOSIS — Z86018 Personal history of other benign neoplasm: Secondary | ICD-10-CM

## 2022-12-21 DIAGNOSIS — M19071 Primary osteoarthritis, right ankle and foot: Secondary | ICD-10-CM | POA: Diagnosis not present

## 2022-12-21 MED ORDER — GADOBUTROL 1 MMOL/ML IV SOLN
8.0000 mL | Freq: Once | INTRAVENOUS | Status: AC | PRN
Start: 2022-12-21 — End: 2022-12-21
  Administered 2022-12-21: 8 mL via INTRAVENOUS
